# Patient Record
Sex: Male | Born: 1950 | Race: White | Hispanic: No | Marital: Married | Smoking: Never smoker
Health system: Southern US, Community
[De-identification: ages and names within clinical notes are randomized; demographics above are authoritative.]

## PROBLEM LIST (undated history)

## (undated) DIAGNOSIS — G4733 Obstructive sleep apnea (adult) (pediatric): Secondary | ICD-10-CM

## (undated) DIAGNOSIS — M629 Disorder of muscle, unspecified: Secondary | ICD-10-CM

## (undated) DIAGNOSIS — T7840XA Allergy, unspecified, initial encounter: Secondary | ICD-10-CM

## (undated) DIAGNOSIS — M1990 Unspecified osteoarthritis, unspecified site: Secondary | ICD-10-CM

## (undated) DIAGNOSIS — S46212A Strain of muscle, fascia and tendon of other parts of biceps, left arm, initial encounter: Secondary | ICD-10-CM

## (undated) DIAGNOSIS — E785 Hyperlipidemia, unspecified: Secondary | ICD-10-CM

## (undated) DIAGNOSIS — H269 Unspecified cataract: Secondary | ICD-10-CM

## (undated) HISTORY — DX: Obstructive sleep apnea (adult) (pediatric): G47.33

## (undated) HISTORY — DX: Unspecified osteoarthritis, unspecified site: M19.90

## (undated) HISTORY — DX: Disorder of muscle, unspecified: M62.9

## (undated) HISTORY — DX: Allergy, unspecified, initial encounter: T78.40XA

## (undated) HISTORY — DX: Hyperlipidemia, unspecified: E78.5

## (undated) HISTORY — DX: Strain of muscle, fascia and tendon of other parts of biceps, left arm, initial encounter: S46.212A

## (undated) HISTORY — DX: Unspecified cataract: H26.9

---

## 2005-12-25 ENCOUNTER — Encounter: Admission: RE | Admit: 2005-12-25 | Discharge: 2005-12-25 | Payer: Self-pay | Admitting: Otolaryngology

## 2007-05-06 ENCOUNTER — Ambulatory Visit (HOSPITAL_COMMUNITY): Admission: RE | Admit: 2007-05-06 | Discharge: 2007-05-06 | Payer: Self-pay | Admitting: Internal Medicine

## 2007-06-23 HISTORY — PX: CATARACT EXTRACTION, BILATERAL: SHX1313

## 2011-06-23 HISTORY — PX: COLONOSCOPY: SHX174

## 2013-11-08 ENCOUNTER — Ambulatory Visit (INDEPENDENT_AMBULATORY_CARE_PROVIDER_SITE_OTHER): Payer: 59 | Admitting: Emergency Medicine

## 2013-11-08 VITALS — BP 124/64 | HR 58 | Temp 98.1°F | Resp 16 | Ht 66.0 in | Wt 188.0 lb

## 2013-11-08 DIAGNOSIS — R059 Cough, unspecified: Secondary | ICD-10-CM

## 2013-11-08 DIAGNOSIS — J309 Allergic rhinitis, unspecified: Secondary | ICD-10-CM

## 2013-11-08 DIAGNOSIS — R05 Cough: Secondary | ICD-10-CM

## 2013-11-08 MED ORDER — FEXOFENADINE HCL 180 MG PO TABS: 180.0000 mg | ORAL_TABLET | Freq: Every day | ORAL | Status: DC

## 2013-11-08 MED ORDER — TRIAMCINOLONE ACETONIDE 55 MCG/ACT NA AERO: 2.0000 | INHALATION_SPRAY | Freq: Every day | NASAL | Status: DC

## 2013-11-08 NOTE — Patient Instructions (Signed)
Allergic Rhinitis Allergic rhinitis is when the mucous membranes in the nose respond to allergens. Allergens are particles in the air that cause your body to have an allergic reaction. This causes you to release allergic antibodies. Through a chain of events, these eventually cause you to release histamine into the blood stream. Although meant to protect the body, it is this release of histamine that causes your discomfort, such as frequent sneezing, congestion, and an itchy, runny nose.  CAUSES  Seasonal allergic rhinitis (hay fever) is caused by pollen allergens that may come from grasses, trees, and weeds. Year-round allergic rhinitis (perennial allergic rhinitis) is caused by allergens such as house dust mites, pet dander, and mold spores.  SYMPTOMS   Nasal stuffiness (congestion).  Itchy, runny nose with sneezing and tearing of the eyes. DIAGNOSIS  Your health care provider can help you determine the allergen or allergens that trigger your symptoms. If you and your health care provider are unable to determine the allergen, skin or blood testing may be used. TREATMENT  Allergic Rhinitis does not have a cure, but it can be controlled by:  Medicines and allergy shots (immunotherapy).  Avoiding the allergen. Hay fever may often be treated with antihistamines in pill or nasal spray forms. Antihistamines block the effects of histamine. There are over-the-counter medicines that may help with nasal congestion and swelling around the eyes. Check with your health care provider before taking or giving this medicine.  If avoiding the allergen or the medicine prescribed do not work, there are many new medicines your health care provider can prescribe. Stronger medicine may be used if initial measures are ineffective. Desensitizing injections can be used if medicine and avoidance does not work. Desensitization is when a patient is given ongoing shots until the body becomes less sensitive to the allergen.  Make sure you follow up with your health care provider if problems continue. HOME CARE INSTRUCTIONS It is not possible to completely avoid allergens, but you can reduce your symptoms by taking steps to limit your exposure to them. It helps to know exactly what you are allergic to so that you can avoid your specific triggers. SEEK MEDICAL CARE IF:   You have a fever.  You develop a cough that does not stop easily (persistent).  You have shortness of breath.  You start wheezing.  Symptoms interfere with normal daily activities. Document Released: 03/03/2001 Document Revised: 03/29/2013 Document Reviewed: 02/13/2013 Exit[employer] Patient Information 2014 [employer].  

## 2013-11-08 NOTE — Progress Notes (Signed)
[facility] [address], [place] [id number] [phone]  Date:  11/08/2013   Name:  John Soto   DOB:  06/01/1951   MRN:  [id number]  PCP:  Delorse LekBURNETT,BRENT A, MD    Chief Complaint: Cough   History of Present Illness:  John Soto is a 63 y.o. very pleasant male patient who presents with the following:  Ill since last week with nasal congestion and post nasal drainage.  Has no sore throat despite an exposure to strep in his child.  Has a cough productive of a small amount of mucopurulent sputum.  No wheezing or shortness of breath. No fever or chills.   No improvement with over the counter medications or other home remedies. Denies other complaint or health concern today.   There are no active problems to display for this patient.   Past Medical History  Diagnosis Date  . Cataract     History reviewed. No pertinent past surgical history.  History  Substance Use Topics  . Smoking status: Never Smoker   . Smokeless tobacco: Not on file  . Alcohol Use: Not on file    Family History  Problem Relation Age of Onset  . Heart disease Mother   . Heart disease Father     No Known Allergies  Medication list has been reviewed and updated.  No current outpatient prescriptions on file prior to visit.   No current facility-administered medications on file prior to visit.    Review of Systems:  As per HPI, otherwise negative.    Physical Examination: Filed Vitals:   11/08/13 1703  BP: 124/64  Pulse: 58  Temp: 98.1 F (36.7 C)  Resp: 16   Filed Vitals:   11/08/13 1703  Height: 5\' 6"  (1.676 m)  Weight: 188 lb (85.276 kg)   Body mass index is 30.36 kg/(m^2). Ideal Body Weight: Weight in (lb) to have BMI = 25: 154.6  GEN: WDWN, NAD, Non-toxic, A & O x 3 HEENT: Atraumatic, Normocephalic. Neck supple. No masses, No LAD. Ears and Nose: No external deformity. CV: RRR, No M/G/R. No JVD. No thrill. No extra heart sounds. PULM: CTA B,  no wheezes, crackles, rhonchi. No retractions. No resp. distress. No accessory muscle use. ABD: S, NT, ND, +BS. No rebound. No HSM. EXTR: No c/c/e NEURO Normal gait.  PSYCH: Normally interactive. Conversant. Not depressed or anxious appearing.  Calm demeanor.    Assessment and Plan: Seasonal allergic rhinitis Allegra nasacort Phen c cod  Signed,  Phillips OdorJeffery , MD

## 2014-07-02 ENCOUNTER — Institutional Professional Consult (permissible substitution): Payer: Self-pay | Admitting: Pulmonary Disease

## 2014-09-21 ENCOUNTER — Institutional Professional Consult (permissible substitution): Payer: Self-pay | Admitting: Pulmonary Disease

## 2014-11-28 ENCOUNTER — Institutional Professional Consult (permissible substitution): Payer: Self-pay | Admitting: Internal Medicine

## 2014-12-04 ENCOUNTER — Ambulatory Visit (INDEPENDENT_AMBULATORY_CARE_PROVIDER_SITE_OTHER): Payer: 59 | Admitting: Family Medicine

## 2014-12-04 VITALS — BP 100/60 | HR 65 | Temp 98.9°F | Resp 17 | Ht 67.0 in | Wt 187.6 lb

## 2014-12-04 DIAGNOSIS — J0100 Acute maxillary sinusitis, unspecified: Secondary | ICD-10-CM | POA: Diagnosis not present

## 2014-12-04 DIAGNOSIS — J209 Acute bronchitis, unspecified: Secondary | ICD-10-CM

## 2014-12-04 MED ORDER — AZITHROMYCIN 250 MG PO TABS: ORAL_TABLET | ORAL | Status: DC

## 2014-12-04 NOTE — Patient Instructions (Signed)

## 2014-12-04 NOTE — Progress Notes (Signed)
Patient ID: John Soto, male   DOB: March 23, 1951, 64 y.o.   MRN: [id number]   This chart was scribed for John Sidle, MD by [facility], medical scribe at [facility]The patient was seen in exam room 12 and the patient's care was started at 4:45 PM.  Patient ID: John Soto MRN: [id number], DOB: [birth year], 64 y.o. Date of Encounter: 12/04/2014  Primary Physician: Delorse Lek, MD  Chief Complaint:  Chief Complaint  Patient presents with  . Nasal Congestion    Coughing- x 3 weeks   HPI:  [name] is a 64 y.o. male who [facility] complaining of sinus congestion, chest congestion and a cough for three weeks. He is producing a green, yellow mucous. He was working around dust while helping his daughter move back home.  These symptoms have progressively worsened. He is able to exercise but is more fatigued than usual. He works at [employer].  Past Medical History  Diagnosis Date  . Cataract     Home Meds: Prior to Admission medications   Medication Sig Start Date End Date Taking? Authorizing Provider  fexofenadine (ALLEGRA ALLERGY) 180 MG tablet Take 1 tablet (180 mg total) by mouth daily. Patient not taking: Reported on 12/04/2014 11/08/13   Carmelina Dane, MD  niacin-lovastatin (ADVICOR) 1000-20 MG 24 hr tablet Take 1 tablet by mouth at bedtime.    Historical Provider, MD  triamcinolone (NASACORT AQ) 55 MCG/ACT AERO nasal inhaler Place 2 sprays into the nose daily. Patient not taking: Reported on 12/04/2014 11/08/13   Carmelina Dane, MD   Allergies: No Known Allergies  History   Social History  . Marital Status: Married    Spouse Name: N/A  . Number of Children: N/A  . Years of Education: N/A   Occupational History  . Not on file.   Social History Main Topics  . Smoking status: Never Smoker   . Smokeless tobacco: Not on file  . Alcohol Use: Not on file  . Drug Use: Not on file  . Sexual Activity: Not on  file   Other Topics Concern  . Not on file   Social History Narrative    Review of Systems: Constitutional: negative for chills, fever, night sweats, weight changes, positive for fatigue  HEENT: negative for vision changes, hearing loss, rhinorrhea, ST, epistaxis. Positive for chest congestion, nasal congestion. Cardiovascular: negative for chest pain or palpitations Respiratory: negative for hemoptysis, wheezing, shortness of breath, positive for cough. Abdominal: negative for abdominal pain, nausea, vomiting, diarrhea, or constipation Dermatological: negative for rash Neurologic: negative for headache, dizziness, or syncope All other systems reviewed and are otherwise negative with the exception to those above and in the HPI.  Physical Exam: Blood pressure 100/60, pulse 65, temperature 98.9 F (37.2 C), temperature source Oral, resp. rate 17, height 5\' 7"  (1.702 m), weight 187 lb 9.6 oz (85.095 kg), SpO2 98 %., Body mass index is 29.38 kg/(m^2). General: Well developed, well nourished, in no acute distress. Head: Normocephalic, atraumatic, eyes without discharge, sclera non-icteric,  Bilateral auditory canals clear, TM's are without perforation, pearly grey and translucent with reflective cone of light bilaterally. Oral cavity moist, posterior pharynx without exudate, erythema, peritonsillar abscess, or post nasal drip. Nasal passages are swollen. Neck: Supple. No thyromegaly. Full ROM. No lymphadenopathy. Lungs: Few crackles in the bases. Heart: RRR with S1 S2. No murmurs, rubs, or gallops appreciated. Abdomen: Soft, non-tender, non-distended with normoactive bowel sounds. No  hepatomegaly. No rebound/guarding. No obvious abdominal masses. Msk:  Strength and tone normal for age. Extremities/Skin: Warm and dry. No clubbing or cyanosis. No edema. No rashes or suspicious lesions. Neuro: Alert and oriented X 3. Moves all extremities spontaneously. Gait is normal. CNII-XII grossly in  tact. Psych:  Responds to questions appropriately with a normal affect.   Labs:  ASSESSMENT AND PLAN:  64 y.o. year old male with   This chart was scribed in my presence and reviewed by me personally.    ICD-9-CM ICD-10-CM   1. Acute bronchitis, unspecified organism 466.0 J20.9 azithromycin (ZITHROMAX Z-PAK) 250 MG tablet  2. Subacute maxillary sinusitis 461.0 J01.00 azithromycin (ZITHROMAX Z-PAK) 250 MG tablet     Signed, John Sidle, MD  Signed, John Sidle, MD 12/04/2014 4:45 PM

## 2015-03-04 ENCOUNTER — Institutional Professional Consult (permissible substitution): Payer: Self-pay | Admitting: Internal Medicine

## 2015-03-22 ENCOUNTER — Telehealth: Payer: Self-pay | Admitting: Family Medicine

## 2015-03-22 NOTE — Telephone Encounter (Signed)
Yes I can see him  ?

## 2015-03-22 NOTE — Telephone Encounter (Signed)
Pt wife Elmor Kost would like to know if Dr. Clent Ridges will accept her husband as a new patient.... Can I schedule?

## 2015-03-22 NOTE — Telephone Encounter (Signed)
Pt has been sch

## 2015-04-24 ENCOUNTER — Encounter: Payer: Self-pay | Admitting: Family Medicine

## 2015-04-24 ENCOUNTER — Ambulatory Visit (INDEPENDENT_AMBULATORY_CARE_PROVIDER_SITE_OTHER): Payer: Commercial Managed Care - HMO | Admitting: Family Medicine

## 2015-04-24 VITALS — BP 126/76 | HR 51 | Temp 98.0°F | Ht 67.0 in | Wt 186.0 lb

## 2015-04-24 DIAGNOSIS — G4733 Obstructive sleep apnea (adult) (pediatric): Secondary | ICD-10-CM | POA: Diagnosis not present

## 2015-04-24 DIAGNOSIS — Z91048 Other nonmedicinal substance allergy status: Secondary | ICD-10-CM

## 2015-04-24 DIAGNOSIS — R739 Hyperglycemia, unspecified: Secondary | ICD-10-CM

## 2015-04-24 DIAGNOSIS — E785 Hyperlipidemia, unspecified: Secondary | ICD-10-CM

## 2015-04-24 DIAGNOSIS — M19042 Primary osteoarthritis, left hand: Secondary | ICD-10-CM

## 2015-04-24 DIAGNOSIS — Z9109 Other allergy status, other than to drugs and biological substances: Secondary | ICD-10-CM

## 2015-04-24 DIAGNOSIS — M19041 Primary osteoarthritis, right hand: Secondary | ICD-10-CM

## 2015-04-24 NOTE — Progress Notes (Signed)
Pre visit review using our clinic review tool, if applicable. No additional management support is needed unless otherwise documented below in the visit note. 

## 2015-04-25 ENCOUNTER — Encounter: Payer: Self-pay | Admitting: Family Medicine

## 2015-04-25 DIAGNOSIS — R739 Hyperglycemia, unspecified: Secondary | ICD-10-CM | POA: Insufficient documentation

## 2015-04-25 DIAGNOSIS — M19042 Primary osteoarthritis, left hand: Secondary | ICD-10-CM

## 2015-04-25 DIAGNOSIS — M19041 Primary osteoarthritis, right hand: Secondary | ICD-10-CM | POA: Insufficient documentation

## 2015-04-25 DIAGNOSIS — Z9109 Other allergy status, other than to drugs and biological substances: Secondary | ICD-10-CM | POA: Insufficient documentation

## 2015-04-25 DIAGNOSIS — E785 Hyperlipidemia, unspecified: Secondary | ICD-10-CM | POA: Insufficient documentation

## 2015-04-25 DIAGNOSIS — G4733 Obstructive sleep apnea (adult) (pediatric): Secondary | ICD-10-CM | POA: Insufficient documentation

## 2015-04-25 NOTE — Progress Notes (Signed)
   Subjective:    Patient ID: John Soto, male    DOB: 07/20/1950, 64 y.o.   MRN: [id number]  HPI 64 yr old male to establish with us after transferring from Dr. Marjory LiesBrent Burnett. I already see his wife and daughter. He is currently doing well and has no complaints. He is a little past due for a cpx and labs. His last total cholesterol was 240 and he admits to not being as strict about his diet as he should be. He has a hx of borderline high glucoses. He walks almost every day for exercise and he goes to the gym 3-5 days a week. He has sleep apnea and he even has a CPAP machine at home, but he never uses this. He has made up his mind to lose weight and treat the sleep apnea by that means. 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    Review of Systems  Constitutional: Negative.   Respiratory: Negative.   Cardiovascular: Negative.   Gastrointestinal: Negative.   Musculoskeletal: Positive for arthralgias. Negative for joint swelling.  Neurological: Negative.        Objective:   Physical Exam  Constitutional: He is oriented to person, place, and time. He appears well-developed and well-nourished.  Neck: No thyromegaly present.  Cardiovascular: Normal rate, regular rhythm, normal heart sounds and intact distal pulses.   Pulmonary/Chest: Effort normal and breath sounds normal.  Lymphadenopathy:    He has no cervical adenopathy.  Neurological: He is alert and oriented to person, place, and time.          Assessment & Plan:  Introductory visit for this patient. We will arrange to get his medical records. He will set up a cpx with fasting labs soon.

## 2015-05-21 ENCOUNTER — Other Ambulatory Visit (INDEPENDENT_AMBULATORY_CARE_PROVIDER_SITE_OTHER): Payer: Commercial Managed Care - HMO

## 2015-05-21 DIAGNOSIS — Z0000 Encounter for general adult medical examination without abnormal findings: Secondary | ICD-10-CM

## 2015-05-21 LAB — LIPID PANEL
CHOL/HDL RATIO: 5
Cholesterol: 221 mg/dL — ABNORMAL HIGH (ref 0–200)
HDL: 41.5 mg/dL (ref >39.00)
LDL CALC: 156 mg/dL — AB (ref 0–99)
NonHDL: 179.2
Triglycerides: 115 mg/dL (ref 0.0–149.0)
VLDL: 23 mg/dL (ref 0.0–40.0)

## 2015-05-21 LAB — POCT URINALYSIS DIPSTICK
BILIRUBIN UA: NEGATIVE
GLUCOSE UA: NEGATIVE
Ketones, UA: NEGATIVE
NITRITE UA: NEGATIVE
Protein, UA: NEGATIVE
RBC UA: NEGATIVE
SPEC GRAV UA: 1.025
UROBILINOGEN UA: 0.2
pH, UA: 5

## 2015-05-21 LAB — CBC WITH DIFFERENTIAL/PLATELET
BASOS PCT: 0.6 % (ref 0.0–3.0)
Basophils Absolute: 0 10*3/uL (ref 0.0–0.1)
EOS ABS: 0.1 10*3/uL (ref 0.0–0.7)
EOS PCT: 2.5 % (ref 0.0–5.0)
HCT: 47 % (ref 39.0–52.0)
Hemoglobin: 15.8 g/dL (ref 13.0–17.0)
LYMPHS ABS: 1.7 10*3/uL (ref 0.7–4.0)
Lymphocytes Relative: 31.4 % (ref 12.0–46.0)
MCHC: 33.5 g/dL (ref 30.0–36.0)
MCV: 85.6 fl (ref 78.0–100.0)
MONO ABS: 0.6 10*3/uL (ref 0.1–1.0)
Monocytes Relative: 10.5 % (ref 3.0–12.0)
NEUTROS PCT: 55 % (ref 43.0–77.0)
Neutro Abs: 3 10*3/uL (ref 1.4–7.7)
Platelets: 256 10*3/uL (ref 150.0–400.0)
RBC: 5.49 Mil/uL (ref 4.22–5.81)
RDW: 13.4 % (ref 11.5–15.5)
WBC: 5.4 10*3/uL (ref 4.0–10.5)

## 2015-05-21 LAB — HEPATIC FUNCTION PANEL
ALT: 19 U/L (ref 0–53)
AST: 16 U/L (ref 0–37)
Albumin: 4.4 g/dL (ref 3.5–5.2)
Alkaline Phosphatase: 50 U/L (ref 39–117)
BILIRUBIN TOTAL: 0.9 mg/dL (ref 0.2–1.2)
Bilirubin, Direct: 0.1 mg/dL (ref 0.0–0.3)
Total Protein: 6.7 g/dL (ref 6.0–8.3)

## 2015-05-21 LAB — PSA: PSA: 2 ng/mL (ref 0.10–4.00)

## 2015-05-21 LAB — BASIC METABOLIC PANEL
BUN: 24 mg/dL — AB (ref 6–23)
CO2: 29 mEq/L (ref 19–32)
CREATININE: 1.2 mg/dL (ref 0.40–1.50)
Calcium: 10 mg/dL (ref 8.4–10.5)
Chloride: 105 mEq/L (ref 96–112)
GFR: 65.01 mL/min (ref >60.00)
GLUCOSE: 110 mg/dL — AB (ref 70–99)
POTASSIUM: 5.3 meq/L — AB (ref 3.5–5.1)
Sodium: 142 mEq/L (ref 135–145)

## 2015-05-21 LAB — TSH: TSH: 1.74 u[IU]/mL (ref 0.35–4.50)

## 2015-05-28 ENCOUNTER — Ambulatory Visit (INDEPENDENT_AMBULATORY_CARE_PROVIDER_SITE_OTHER): Payer: Commercial Managed Care - HMO | Admitting: Family Medicine

## 2015-05-28 ENCOUNTER — Encounter: Payer: Self-pay | Admitting: Family Medicine

## 2015-05-28 VITALS — BP 128/70 | HR 55 | Temp 97.8°F | Ht 66.5 in | Wt 189.0 lb

## 2015-05-28 DIAGNOSIS — Z0000 Encounter for general adult medical examination without abnormal findings: Secondary | ICD-10-CM

## 2015-05-28 DIAGNOSIS — E785 Hyperlipidemia, unspecified: Secondary | ICD-10-CM

## 2015-05-28 DIAGNOSIS — R739 Hyperglycemia, unspecified: Secondary | ICD-10-CM

## 2015-05-28 NOTE — Progress Notes (Signed)
   Subjective:    Patient ID: John Soto, male    DOB: 09/15/1950, 64 y.o.   MRN: [id number]  HPI 64 yr old male for a cpx. He feels well. He confesses that he stopped taking his lipid medication one month ago, and he wants to try to get his levels down with diet and exercise.    Review of Systems  Constitutional: Negative.   HENT: Negative.   Eyes: Negative.   Respiratory: Negative.   Cardiovascular: Negative.   Gastrointestinal: Negative.   Genitourinary: Negative.   Musculoskeletal: Negative.   Skin: Negative.   Neurological: Negative.   Psychiatric/Behavioral: Negative.        Objective:   Physical Exam  Constitutional: He is oriented to person, place, and time. He appears well-developed and well-nourished. No distress.  HENT:  Head: Normocephalic and atraumatic.  Right Ear: External ear normal.  Left Ear: External ear normal.  Nose: Nose normal.  Mouth/Throat: Oropharynx is clear and moist. No oropharyngeal exudate.  Eyes: Conjunctivae and EOM are normal. Pupils are equal, round, and reactive to light. Right eye exhibits no discharge. Left eye exhibits no discharge. No scleral icterus.  Neck: Neck supple. No JVD present. No tracheal deviation present. No thyromegaly present.  Cardiovascular: Normal rate, regular rhythm, normal heart sounds and intact distal pulses.  Exam reveals no gallop and no friction rub.   No murmur heard. EKG normal   Pulmonary/Chest: Effort normal and breath sounds normal. No respiratory distress. He has no wheezes. He has no rales. He exhibits no tenderness.  Abdominal: Soft. Bowel sounds are normal. He exhibits no distension and no mass. There is no tenderness. There is no rebound and no guarding.  Genitourinary: Rectum normal, prostate normal and penis normal. Guaiac negative stool. No penile tenderness.  Musculoskeletal: Normal range of motion. He exhibits no edema or tenderness.  Lymphadenopathy:    He has no cervical adenopathy.    Neurological: He is alert and oriented to person, place, and time. He has normal reflexes. No cranial nerve deficit. He exhibits normal muscle tone. Coordination normal.  Skin: Skin is warm and dry. No rash noted. He is not diaphoretic. No erythema. No pallor.  Psychiatric: He has a normal mood and affect. His behavior is normal. Judgment and thought content normal.          Assessment & Plan:  Well exam. We discussed diet and exercise. He will stay off meds for now and recheck an A1c and lipid panel in 6 months.

## 2015-05-28 NOTE — Progress Notes (Signed)
Pre visit review using our clinic review tool, if applicable. No additional management support is needed unless otherwise documented below in the visit note. 

## 2015-06-10 ENCOUNTER — Ambulatory Visit: Payer: Commercial Managed Care - HMO

## 2015-06-10 ENCOUNTER — Ambulatory Visit (INDEPENDENT_AMBULATORY_CARE_PROVIDER_SITE_OTHER): Payer: Commercial Managed Care - HMO | Admitting: Emergency Medicine

## 2015-06-10 ENCOUNTER — Ambulatory Visit (INDEPENDENT_AMBULATORY_CARE_PROVIDER_SITE_OTHER): Payer: Commercial Managed Care - HMO

## 2015-06-10 VITALS — BP 136/82 | HR 82 | Temp 97.5°F | Resp 20

## 2015-06-10 DIAGNOSIS — M25512 Pain in left shoulder: Secondary | ICD-10-CM | POA: Diagnosis not present

## 2015-06-10 DIAGNOSIS — S43005A Unspecified dislocation of left shoulder joint, initial encounter: Secondary | ICD-10-CM

## 2015-06-10 MED ORDER — HYDROCODONE-ACETAMINOPHEN 5-325 MG PO TABS: 1.0000 | ORAL_TABLET | Freq: Four times a day (QID) | ORAL | Status: DC | PRN

## 2015-06-10 MED ORDER — KETOROLAC TROMETHAMINE 60 MG/2ML IM SOLN
60.0000 mg | Freq: Once | INTRAMUSCULAR | Status: AC
  Administered 2015-06-10: 60 mg via INTRAMUSCULAR

## 2015-06-10 MED ORDER — CYCLOBENZAPRINE HCL 5 MG PO TABS: 5.0000 mg | ORAL_TABLET | Freq: Three times a day (TID) | ORAL | Status: DC | PRN

## 2015-06-10 NOTE — Patient Instructions (Signed)
Ice to area.

## 2015-06-10 NOTE — Progress Notes (Signed)
   John GalasRichard G Soto  MRN: [id number] DOB: 09/07/1950  Subjective:  Pt presents to clinic after a fall about an hour ago when he tripped an landed on his left shoulder and had intense pain. He thinks that he dislocated his shoulder.  He is having no paresthesias on his hand.  Patient Active Problem List   Diagnosis Date Noted  . Obstructive sleep apnea 04/25/2015  . Hyperlipidemia 04/25/2015  . Hyperglycemia 04/25/2015  . Environmental allergies 04/25/2015  . Osteoarthritis of both hands 04/25/2015    No current outpatient prescriptions on file prior to visit.   No current facility-administered medications on file prior to visit.    No Known Allergies  Review of Systems Objective:  BP 136/82 mmHg  Pulse 82  Temp(Src) 97.5 F (36.4 C) (Oral)  Resp 20  SpO2 98%  Physical Exam  Constitutional: He is oriented to person, place, and time and well-developed, well-nourished, and in no distress.  HENT:  Head: Normocephalic and atraumatic.  Right Ear: External ear normal.  Left Ear: External ear normal.  Eyes: Conjunctivae are normal.  Neck: Normal range of motion.  Pulmonary/Chest: Effort normal.  Musculoskeletal:  Obvious sulcus on left shoulder - pt in obvious discomfort.  Dr Cleta Albertsaub and I with traction relocated the shoulder.  The patient had a good radial pulse before and after the relocation.  He was still quite tender on posterior shoulder but once he got in a sling and had an injection of Toradol he felt much better.    Neurological: He is alert and oriented to person, place, and time. Gait normal.  Skin: Skin is warm and dry.  Psychiatric: Mood, memory, affect and judgment normal.    [facility] reading (PRIMARY) by  Dr. Cleta Albertsaub.  Dislocation post-reduction films - please comment on head of humerus and calcification in joint space  Assessment and Plan :  Left shoulder pain - Plan: DG Shoulder Left, DG Shoulder Left  Shoulder dislocation, left, initial encounter - Plan: DG Shoulder  Left, ketorolac (TORADOL) injection 60 mg, cyclobenzaprine (FLEXERIL) 5 MG tablet, HYDROcodone-acetaminophen (NORCO/VICODIN) 5-325 MG tablet, Ambulatory referral to Orthopedic Surgery, Care order/instruction   Pt will f/u with ortho.  He will use ice and continue sling and swath for protection of the shoulder to prevent repeat dislocation.  Benny LennertSarah Weber PA-C  [facility] 06/10/2015 8:34 PM

## 2015-06-11 ENCOUNTER — Telehealth: Payer: Self-pay

## 2015-06-11 NOTE — Telephone Encounter (Signed)
Pt would like to come by and pick up a copy of his xrays of the shoulder, have an appt on Thursday. Please call [phone] when ready, he would really like to pick up today if possible

## 2015-06-28 ENCOUNTER — Institutional Professional Consult (permissible substitution): Payer: Self-pay | Admitting: Internal Medicine

## 2016-05-28 ENCOUNTER — Other Ambulatory Visit (INDEPENDENT_AMBULATORY_CARE_PROVIDER_SITE_OTHER): Payer: Managed Care, Other (non HMO)

## 2016-05-28 DIAGNOSIS — Z0000 Encounter for general adult medical examination without abnormal findings: Secondary | ICD-10-CM | POA: Diagnosis not present

## 2016-05-28 LAB — LIPID PANEL
CHOL/HDL RATIO: 5
Cholesterol: 232 mg/dL — ABNORMAL HIGH (ref 0–200)
HDL: 47.3 mg/dL (ref >39.00)
LDL Cholesterol: 166 mg/dL — ABNORMAL HIGH (ref 0–99)
NONHDL: 184.39
TRIGLYCERIDES: 93 mg/dL (ref 0.0–149.0)
VLDL: 18.6 mg/dL (ref 0.0–40.0)

## 2016-05-28 LAB — POC URINALSYSI DIPSTICK (AUTOMATED)
Bilirubin, UA: NEGATIVE
Blood, UA: NEGATIVE
Glucose, UA: NEGATIVE
KETONES UA: NEGATIVE
Nitrite, UA: NEGATIVE
PH UA: 5.5
PROTEIN UA: NEGATIVE
SPEC GRAV UA: 1.015
UROBILINOGEN UA: 0.2

## 2016-05-28 LAB — HEPATIC FUNCTION PANEL
ALBUMIN: 4.6 g/dL (ref 3.5–5.2)
ALT: 18 U/L (ref 0–53)
AST: 15 U/L (ref 0–37)
Alkaline Phosphatase: 44 U/L (ref 39–117)
Bilirubin, Direct: 0.1 mg/dL (ref 0.0–0.3)
TOTAL PROTEIN: 7.2 g/dL (ref 6.0–8.3)
Total Bilirubin: 0.6 mg/dL (ref 0.2–1.2)

## 2016-05-28 LAB — BASIC METABOLIC PANEL
BUN: 17 mg/dL (ref 6–23)
CALCIUM: 9.6 mg/dL (ref 8.4–10.5)
CO2: 26 meq/L (ref 19–32)
CREATININE: 0.97 mg/dL (ref 0.40–1.50)
Chloride: 105 mEq/L (ref 96–112)
GFR: 82.84 mL/min (ref >60.00)
Glucose, Bld: 106 mg/dL — ABNORMAL HIGH (ref 70–99)
Potassium: 4.4 mEq/L (ref 3.5–5.1)
SODIUM: 140 meq/L (ref 135–145)

## 2016-05-28 LAB — CBC WITH DIFFERENTIAL/PLATELET
BASOS PCT: 0.5 % (ref 0.0–3.0)
Basophils Absolute: 0 10*3/uL (ref 0.0–0.1)
EOS ABS: 0.1 10*3/uL (ref 0.0–0.7)
EOS PCT: 1.8 % (ref 0.0–5.0)
HEMATOCRIT: 45.6 % (ref 39.0–52.0)
HEMOGLOBIN: 15.5 g/dL (ref 13.0–17.0)
LYMPHS PCT: 30.6 % (ref 12.0–46.0)
Lymphs Abs: 1.8 10*3/uL (ref 0.7–4.0)
MCHC: 34 g/dL (ref 30.0–36.0)
MCV: 86.2 fl (ref 78.0–100.0)
Monocytes Absolute: 0.6 10*3/uL (ref 0.1–1.0)
Monocytes Relative: 9.7 % (ref 3.0–12.0)
Neutro Abs: 3.3 10*3/uL (ref 1.4–7.7)
Neutrophils Relative %: 57.4 % (ref 43.0–77.0)
Platelets: 251 10*3/uL (ref 150.0–400.0)
RBC: 5.29 Mil/uL (ref 4.22–5.81)
RDW: 13.7 % (ref 11.5–15.5)
WBC: 5.8 10*3/uL (ref 4.0–10.5)

## 2016-05-28 LAB — PSA: PSA: 2.13 ng/mL (ref 0.10–4.00)

## 2016-05-28 LAB — TSH: TSH: 1.57 u[IU]/mL (ref 0.35–4.50)

## 2016-06-01 ENCOUNTER — Ambulatory Visit (INDEPENDENT_AMBULATORY_CARE_PROVIDER_SITE_OTHER): Payer: Managed Care, Other (non HMO) | Admitting: Family Medicine

## 2016-06-01 ENCOUNTER — Encounter: Payer: Self-pay | Admitting: Family Medicine

## 2016-06-01 VITALS — BP 98/63 | HR 65 | Temp 98.4°F | Ht 66.5 in | Wt 176.0 lb

## 2016-06-01 DIAGNOSIS — Z0000 Encounter for general adult medical examination without abnormal findings: Secondary | ICD-10-CM | POA: Diagnosis not present

## 2016-06-01 DIAGNOSIS — Z209 Contact with and (suspected) exposure to unspecified communicable disease: Secondary | ICD-10-CM | POA: Diagnosis not present

## 2016-06-01 NOTE — Progress Notes (Signed)
   Subjective:    Patient ID: John Soto, male    DOB: 03/19/1951, 65 y.o.   MRN: [id number]  HPI 65 yr old male for a well exam. He is doing well except for recovering from a severe left dislocated shoulder. He fell and injured the shoulder last December, tearing 3 rotator cuff muscles and his biceps. He had surgery and is still going to therapy.    Review of Systems  Constitutional: Negative.   HENT: Negative.   Eyes: Negative.   Respiratory: Negative.   Cardiovascular: Negative.   Gastrointestinal: Negative.   Genitourinary: Negative.   Musculoskeletal: Positive for arthralgias. Negative for back pain, gait problem, joint swelling, myalgias, neck pain and neck stiffness.  Skin: Negative.   Neurological: Negative.   Psychiatric/Behavioral: Negative.        Objective:   Physical Exam  Constitutional: He is oriented to person, place, and time. He appears well-developed and well-nourished. No distress.  HENT:  Head: Normocephalic and atraumatic.  Right Ear: External ear normal.  Left Ear: External ear normal.  Nose: Nose normal.  Mouth/Throat: Oropharynx is clear and moist. No oropharyngeal exudate.  Eyes: Conjunctivae and EOM are normal. Pupils are equal, round, and reactive to light. Right eye exhibits no discharge. Left eye exhibits no discharge. No scleral icterus.  Neck: Neck supple. No JVD present. No tracheal deviation present. No thyromegaly present.  Cardiovascular: Normal rate, regular rhythm, normal heart sounds and intact distal pulses.  Exam reveals no gallop and no friction rub.   No murmur heard. Pulmonary/Chest: Effort normal and breath sounds normal. No respiratory distress. He has no wheezes. He has no rales. He exhibits no tenderness.  Abdominal: Soft. Bowel sounds are normal. He exhibits no distension and no mass. There is no tenderness. There is no rebound and no guarding.  Genitourinary: Rectum normal, prostate normal and penis normal. Rectal exam shows  guaiac negative stool. No penile tenderness.  Musculoskeletal: Normal range of motion. He exhibits no edema or tenderness.  Lymphadenopathy:    He has no cervical adenopathy.  Neurological: He is alert and oriented to person, place, and time. He has normal reflexes. No cranial nerve deficit. He exhibits normal muscle tone. Coordination normal.  Skin: Skin is warm and dry. No rash noted. He is not diaphoretic. No erythema. No pallor.  Psychiatric: He has a normal mood and affect. His behavior is normal. Judgment and thought content normal.          Assessment & Plan:  Well exam. We discussed diet and exercise.  Nelwyn SalisburyFRY, A, MD

## 2016-06-01 NOTE — Progress Notes (Signed)
Pre visit review using our clinic review tool, if applicable. No additional management support is needed unless otherwise documented below in the visit note. 

## 2016-06-02 LAB — HEPATITIS C ANTIBODY: HCV AB: NEGATIVE

## 2016-09-22 ENCOUNTER — Ambulatory Visit: Payer: Managed Care, Other (non HMO) | Admitting: Adult Health

## 2017-02-23 ENCOUNTER — Ambulatory Visit (INDEPENDENT_AMBULATORY_CARE_PROVIDER_SITE_OTHER): Payer: 59 | Admitting: Family Medicine

## 2017-02-23 ENCOUNTER — Encounter: Payer: Self-pay | Admitting: Family Medicine

## 2017-02-23 VITALS — BP 128/84 | HR 60 | Temp 98.1°F | Ht 66.5 in | Wt 176.0 lb

## 2017-02-23 DIAGNOSIS — R109 Unspecified abdominal pain: Secondary | ICD-10-CM | POA: Diagnosis not present

## 2017-02-23 DIAGNOSIS — N200 Calculus of kidney: Secondary | ICD-10-CM | POA: Diagnosis not present

## 2017-02-23 DIAGNOSIS — E782 Mixed hyperlipidemia: Secondary | ICD-10-CM

## 2017-02-23 DIAGNOSIS — R1030 Lower abdominal pain, unspecified: Secondary | ICD-10-CM | POA: Diagnosis not present

## 2017-02-23 LAB — POC URINALSYSI DIPSTICK (AUTOMATED)
BILIRUBIN UA: NEGATIVE
GLUCOSE UA: NEGATIVE
Ketones, UA: NEGATIVE
Leukocytes, UA: NEGATIVE
Nitrite, UA: NEGATIVE
Protein, UA: NEGATIVE
SPEC GRAV UA: 1.01 (ref 1.010–1.025)
Urobilinogen, UA: 0.2 E.U./dL
pH, UA: 6 (ref 5.0–8.0)

## 2017-02-23 MED ORDER — OXYCODONE-ACETAMINOPHEN 10-325 MG PO TABS: 1.0000 | ORAL_TABLET | Freq: Four times a day (QID) | ORAL | 0 refills | Status: DC | PRN

## 2017-02-23 NOTE — Patient Instructions (Signed)
WE NOW OFFER   [facility] FAST TRACK!!!  SAME DAY Appointments for ACUTE CARE  Such as: Sprains, Injuries, cuts, abrasions, rashes, muscle pain, joint pain, back pain Colds, flu, sore throats, headache, allergies, cough, fever  Ear pain, sinus and eye infections Abdominal pain, nausea, vomiting, diarrhea, upset stomach Animal/insect bites  3 Easy Ways to Schedule: Walk-In Scheduling Call in scheduling Mychart Sign-up: [email or web address]         

## 2017-02-23 NOTE — Addendum Note (Signed)
Addended by: Aniceto BossNIMMONS, SYLVIA A on: 02/23/2017 04:45 PM   Modules accepted: Orders

## 2017-02-23 NOTE — Progress Notes (Signed)
   Subjective:    Patient ID: John Soto, male    DOB: 09/08/1950, 66 y.o.   MRN: [id number]  HPI Here for the sudden onset last night of a sharp severe pain in the left middle back area. He has never had this before. He says position changes did not make it worse or better. He had some nausea with it but did not vomit. No fever. He had gone backpacking earlier in the day yesterday. No change in urinations or BMs. No blood in the urine but he said it looked a little dark yesterday.  The pain lasted about 8 hours and then suddenly stopped. He has felt okay most of today.    Review of Systems  Constitutional: Negative.   Respiratory: Negative.   Cardiovascular: Negative.   Gastrointestinal: Negative.   Genitourinary: Negative.   Musculoskeletal: Positive for back pain.       Objective:   Physical Exam  Constitutional: He is oriented to person, place, and time. He appears well-developed and well-nourished. No distress.  Cardiovascular: Normal rate, regular rhythm, normal heart sounds and intact distal pulses.   Pulmonary/Chest: Effort normal and breath sounds normal. No respiratory distress. He has no wheezes. He has no rales.  Abdominal: Soft. Bowel sounds are normal. He exhibits no distension and no mass. There is no tenderness. There is no rebound and no guarding.  Musculoskeletal:  The back is not tender, ROM is full   Neurological: He is alert and oriented to person, place, and time.          Assessment & Plan:  Back pain, suggestive of a kidney stone. His UA has a little blood in it today. We will give him some pain medication to use prn. He will drink lots of water. We will set up a CT scan of the abdomen and pelvis soon.  Gershon CraneStephen Fry, MD

## 2017-02-24 LAB — LIPID PANEL
CHOL/HDL RATIO: 4
Cholesterol: 207 mg/dL — ABNORMAL HIGH (ref 0–200)
HDL: 47.9 mg/dL (ref >39.00)
LDL Cholesterol: 138 mg/dL — ABNORMAL HIGH (ref 0–99)
NONHDL: 159.36
TRIGLYCERIDES: 108 mg/dL (ref 0.0–149.0)
VLDL: 21.6 mg/dL (ref 0.0–40.0)

## 2017-02-24 LAB — BASIC METABOLIC PANEL
BUN: 14 mg/dL (ref 6–23)
CHLORIDE: 104 meq/L (ref 96–112)
CO2: 27 meq/L (ref 19–32)
CREATININE: 0.94 mg/dL (ref 0.40–1.50)
Calcium: 9.6 mg/dL (ref 8.4–10.5)
GFR: 85.69 mL/min (ref >60.00)
Glucose, Bld: 99 mg/dL (ref 70–99)
Potassium: 4.6 mEq/L (ref 3.5–5.1)
Sodium: 139 mEq/L (ref 135–145)

## 2017-02-24 LAB — CBC WITH DIFFERENTIAL/PLATELET
BASOS PCT: 1.3 % (ref 0.0–3.0)
Basophils Absolute: 0.1 10*3/uL (ref 0.0–0.1)
EOS ABS: 0.1 10*3/uL (ref 0.0–0.7)
Eosinophils Relative: 0.6 % (ref 0.0–5.0)
HCT: 46.2 % (ref 39.0–52.0)
Hemoglobin: 15.2 g/dL (ref 13.0–17.0)
LYMPHS PCT: 22 % (ref 12.0–46.0)
Lymphs Abs: 2 10*3/uL (ref 0.7–4.0)
MCHC: 32.8 g/dL (ref 30.0–36.0)
MCV: 88.9 fl (ref 78.0–100.0)
Monocytes Absolute: 0.7 10*3/uL (ref 0.1–1.0)
Monocytes Relative: 8.1 % (ref 3.0–12.0)
NEUTROS ABS: 6.2 10*3/uL (ref 1.4–7.7)
Neutrophils Relative %: 68 % (ref 43.0–77.0)
PLATELETS: 242 10*3/uL (ref 150.0–400.0)
RBC: 5.2 Mil/uL (ref 4.22–5.81)
RDW: 13.6 % (ref 11.5–15.5)
WBC: 9.2 10*3/uL (ref 4.0–10.5)

## 2017-02-24 NOTE — Addendum Note (Signed)
Addended by: Gershon CraneFRY,  A on: 02/24/2017 04:13 PM   Modules accepted: Orders

## 2017-02-25 ENCOUNTER — Ambulatory Visit
Admission: RE | Admit: 2017-02-25 | Discharge: 2017-02-25 | Disposition: A | Discharge status: Home / Self Care | Payer: 59 | Source: Ambulatory Visit | Attending: Family Medicine | Admitting: Family Medicine

## 2017-02-25 ENCOUNTER — Telehealth: Payer: Self-pay | Admitting: Family Medicine

## 2017-02-25 ENCOUNTER — Other Ambulatory Visit: Payer: Self-pay | Admitting: Adult Health

## 2017-02-25 DIAGNOSIS — R109 Unspecified abdominal pain: Secondary | ICD-10-CM

## 2017-02-25 MED ORDER — TAMSULOSIN HCL 0.4 MG PO CAPS: 0.4000 mg | ORAL_CAPSULE | Freq: Every day | ORAL | 1 refills | Status: DC

## 2017-02-25 NOTE — Telephone Encounter (Signed)
I spoke with pt and went over results, pt will pick up script today. Also I will forward this to Dr. Clent RidgesFry for review.

## 2017-02-25 NOTE — Telephone Encounter (Signed)
CT of the abomen shows a 2 x 4 mm kidney stone on the left side with additional small kidney stones.   I will call in a prescription for Flomax to help him pass the stone easier.

## 2017-02-25 NOTE — Telephone Encounter (Signed)
Report was called to office today. Dr. Clent RidgesFry has left for the day, I will ask another provider to review results from CT.

## 2017-03-01 NOTE — Telephone Encounter (Signed)
noted 

## 2017-03-02 NOTE — Addendum Note (Signed)
Addended by: Gershon CraneFRY,  A on: 03/02/2017 04:51 PM   Modules accepted: Orders

## 2017-03-03 ENCOUNTER — Encounter: Payer: Self-pay | Admitting: Family Medicine

## 2017-03-03 NOTE — Telephone Encounter (Signed)
He is correct. The stone is almost to the bladder. His back pain is from back pressure in the kidney. His Urology referral is pending.

## 2017-03-11 ENCOUNTER — Encounter: Payer: Self-pay | Admitting: Family Medicine

## 2017-07-14 ENCOUNTER — Ambulatory Visit (INDEPENDENT_AMBULATORY_CARE_PROVIDER_SITE_OTHER): Payer: 59 | Admitting: Family Medicine

## 2017-07-14 ENCOUNTER — Encounter: Payer: Self-pay | Admitting: Family Medicine

## 2017-07-14 VITALS — BP 110/60 | HR 73 | Temp 98.5°F | Ht 66.5 in | Wt 186.8 lb

## 2017-07-14 DIAGNOSIS — J029 Acute pharyngitis, unspecified: Secondary | ICD-10-CM

## 2017-07-14 LAB — POCT RAPID STREP A (OFFICE): RAPID STREP A SCREEN: NEGATIVE

## 2017-07-14 MED ORDER — AMOXICILLIN-POT CLAVULANATE 875-125 MG PO TABS: 1.0000 | ORAL_TABLET | Freq: Two times a day (BID) | ORAL | 0 refills | Status: DC

## 2017-07-14 NOTE — Progress Notes (Signed)
Subjective:     Patient ID: John Soto, male   DOB: 03/28/1951, 67 y.o.   MRN: [id number]  HPI Patient seen with, couple day history of sore throat and nasal congestion with some brownish nasal discharge. Had some fatigue. Rare cough. He was concerned because apparently his daughter was diagnosed with non-group A strep couple weeks ago. Patient denies any fever. Sore throat has been progressing in intensity. He had some facial pain.  Past Medical History:  Diagnosis Date  . Allergy   . 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 Mina MarbleWeingold   . Cataract   . Hyperlipidemia   . Nontraumatic tear of plantar fascia    right foot   . Sleep apnea, obstructive   . Tear of left biceps muscle    Past Surgical History:  Procedure Laterality Date  . CATARACT EXTRACTION, BILATERAL  2009  . COLONOSCOPY  2013   per Dr. Jeralene PetersGanim, clear, repeat in 10 yrs     reports that  has never smoked. he has never used smokeless tobacco. He reports that he does not drink alcohol or use drugs. family history includes Heart disease in his father and mother. No Known Allergies   Review of Systems  Constitutional: Positive for fatigue.  HENT: Positive for congestion, sinus pressure, sinus pain and sore throat.        Objective:   Physical Exam  Constitutional: He appears well-developed and well-nourished.  HENT:  Right Ear: External ear normal.  Left Ear: External ear normal.  Posterior pharynx moderate erythema. No exudate  Neck: Neck supple.  Cardiovascular: Normal rate and regular rhythm.  Pulmonary/Chest: Effort normal and breath sounds normal. No respiratory distress. He has no wheezes. He has no rales.  Lymphadenopathy:    He has no cervical adenopathy.       Assessment:     Upper respiratory infection. Clinically, doubt strep. Rapid strep negative. Patient requesting throat culture    Plan:     -Throat culture obtained -Treat symptomatically -Consider treatment with Augmentin if symptoms  persist or worsen  Kristian CoveyBruce W  MD [facility]

## 2017-07-14 NOTE — Patient Instructions (Signed)

## 2017-07-16 LAB — CULTURE, GROUP A STREP
MICRO NUMBER:: 90096648
SPECIMEN QUALITY:: ADEQUATE

## 2017-07-29 IMAGING — CR DG SHOULDER 2+V*L*
2 series · 2 of 2 positions shown · non-contrast
Comparison: None

CLINICAL DATA: Fall onto left shoulder, severe left shoulder pain

EXAM:
LEFT SHOULDER - 2+ VIEW

[AP (1 of 2)]
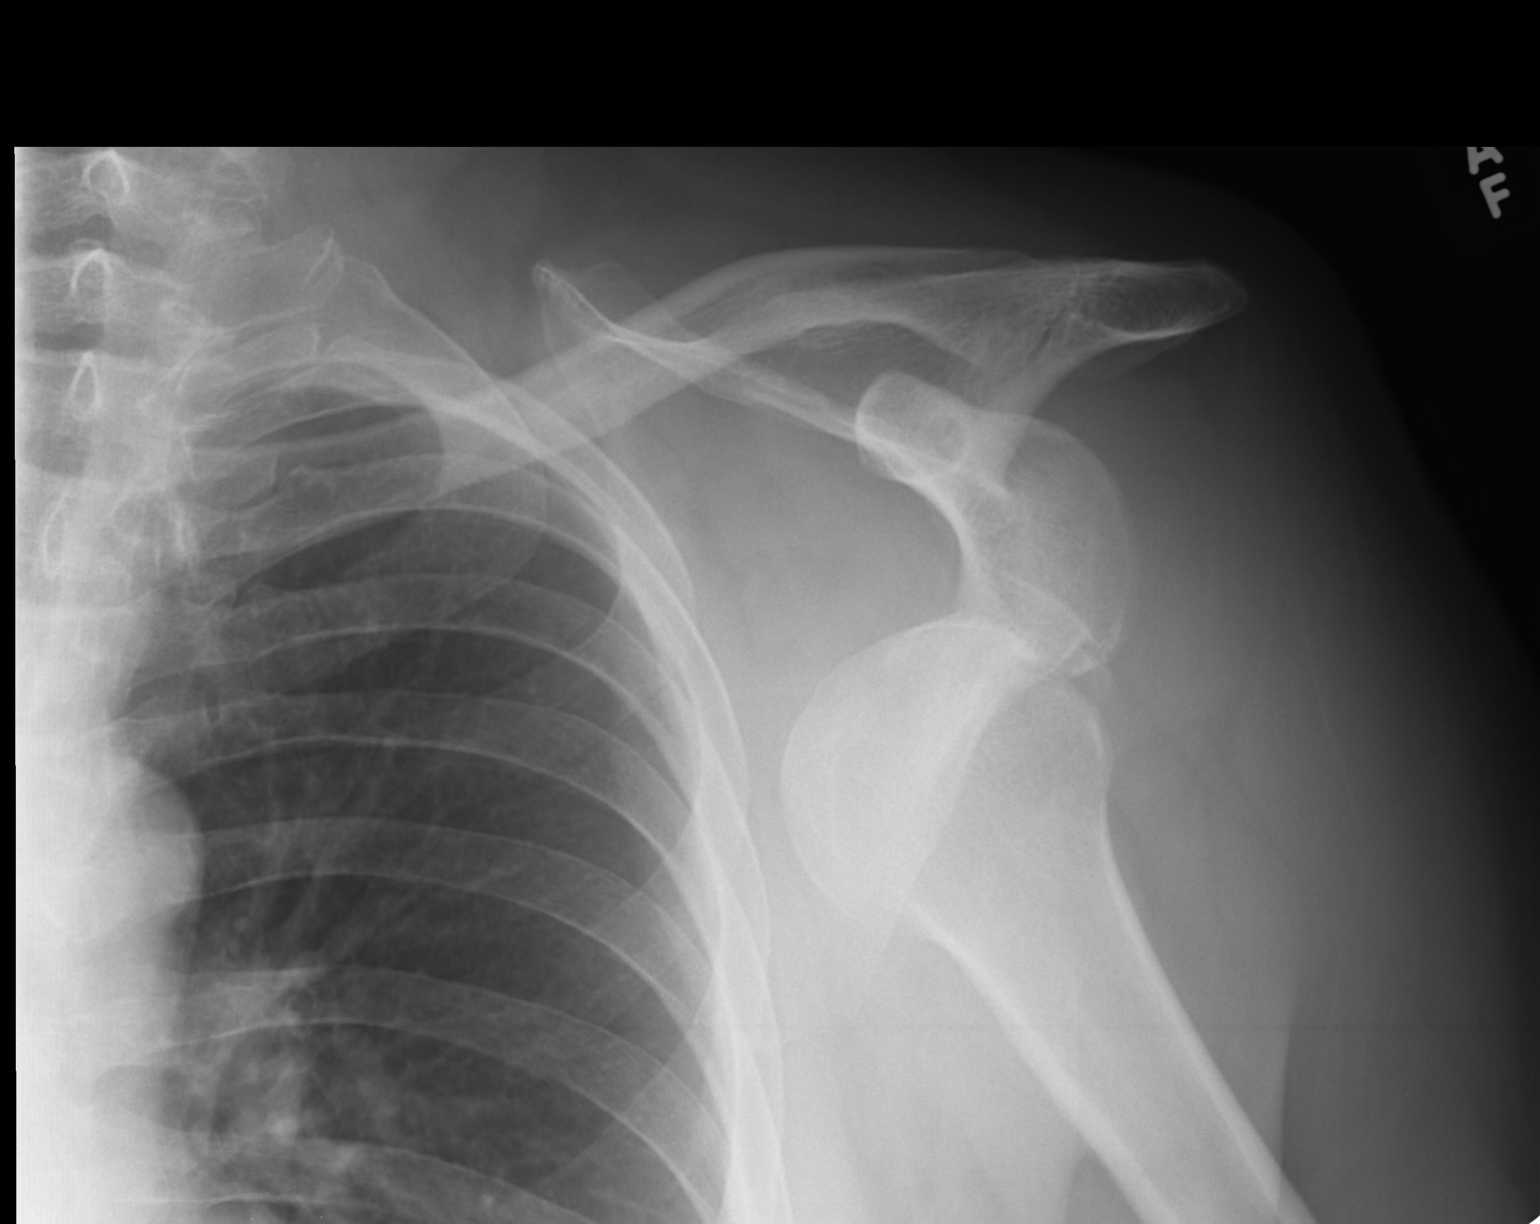

[AP (2 of 2)]
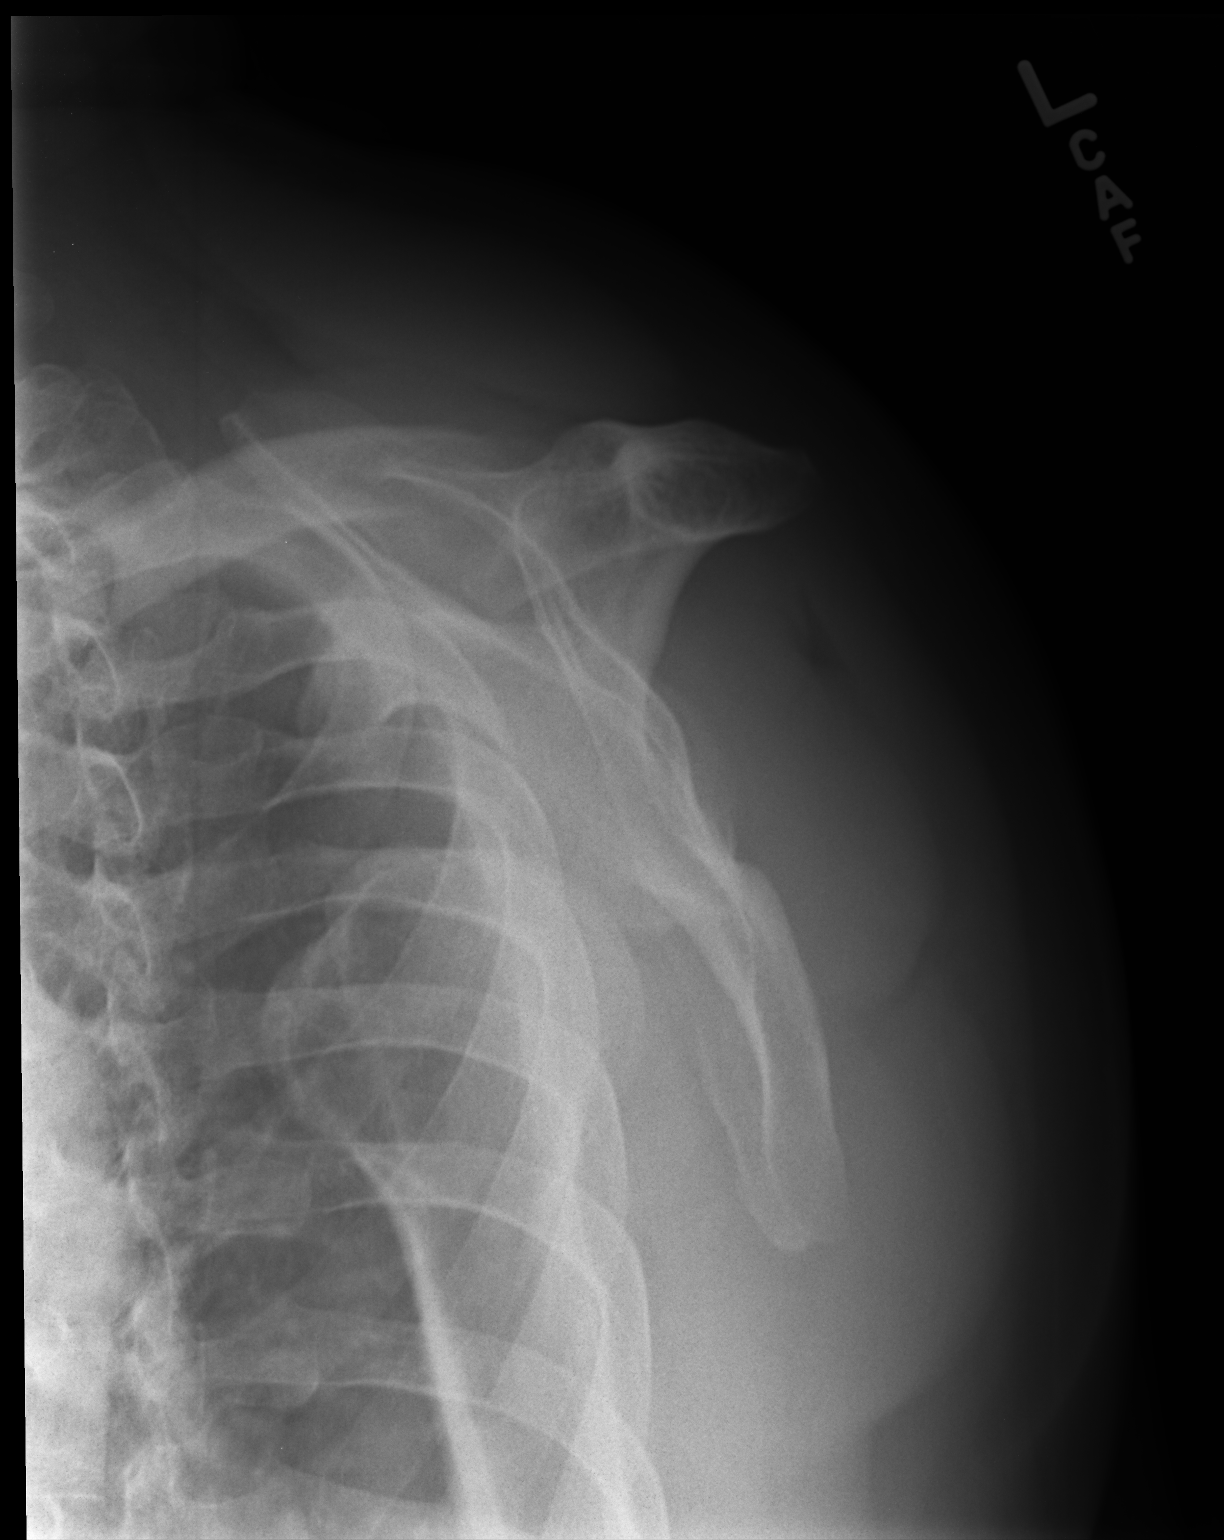

[2 of 2 positions shown; findings below may reference images not displayed]

FINDINGS: There is anterior left shoulder dislocation. Small bone fragments
are noted lateral to the humeral head, likely avulsed fragments.
IMPRESSION: Anterior left shoulder dislocation. Probable avulsed fragments along
lateral humeral head.

## 2017-11-11 ENCOUNTER — Ambulatory Visit (INDEPENDENT_AMBULATORY_CARE_PROVIDER_SITE_OTHER): Payer: 59 | Admitting: Family Medicine

## 2017-11-11 ENCOUNTER — Encounter: Payer: Self-pay | Admitting: Family Medicine

## 2017-11-11 VITALS — BP 118/68 | HR 52 | Temp 97.8°F | Ht 66.5 in | Wt 182.2 lb

## 2017-11-11 DIAGNOSIS — J069 Acute upper respiratory infection, unspecified: Secondary | ICD-10-CM

## 2017-11-11 MED ORDER — BENZONATATE 200 MG PO CAPS: 200.0000 mg | ORAL_CAPSULE | Freq: Two times a day (BID) | ORAL | 0 refills | Status: DC | PRN

## 2017-11-11 NOTE — Progress Notes (Signed)
   Subjective:    Patient ID: [name], male    DOB: 1951/01/13, 67 y.o.   MRN: [id number]  HPI Here for 10 days of stuffy head, PND, and a cough producing clear mucus. No fever. Using Nyquil at night. Today he actually feels much better.    Review of Systems  Constitutional: Negative.   HENT: Positive for congestion and postnasal drip. Negative for sinus pressure, sinus pain and sore throat.   Eyes: Negative.   Respiratory: Positive for cough.        Objective:   Physical Exam  Constitutional: He appears well-developed and well-nourished. No distress.  HENT:  Right Ear: External ear normal.  Left Ear: External ear normal.  Nose: Nose normal.  Mouth/Throat: Oropharynx is clear and moist.  Eyes: Conjunctivae are normal.  Neck: No thyromegaly present.  Pulmonary/Chest: Effort normal and breath sounds normal. No stridor. No respiratory distress. He has no wheezes. He has no rales.  Lymphadenopathy:    He has no cervical adenopathy.          Assessment & Plan:  Viral URI. Try Benzonatate for cough. Drink fluids. Recheck prn.  Gershon Crane, MD

## 2018-04-20 ENCOUNTER — Ambulatory Visit (INDEPENDENT_AMBULATORY_CARE_PROVIDER_SITE_OTHER): Payer: 59 | Admitting: Family Medicine

## 2018-04-20 ENCOUNTER — Encounter: Payer: Self-pay | Admitting: Family Medicine

## 2018-04-20 VITALS — BP 114/76 | HR 67 | Temp 97.8°F | Ht 66.0 in | Wt 175.2 lb

## 2018-04-20 DIAGNOSIS — Z23 Encounter for immunization: Secondary | ICD-10-CM

## 2018-04-20 DIAGNOSIS — Z0000 Encounter for general adult medical examination without abnormal findings: Secondary | ICD-10-CM

## 2018-04-20 LAB — CBC WITH DIFFERENTIAL/PLATELET
BASOS ABS: 0 10*3/uL (ref 0.0–0.1)
BASOS PCT: 0.4 % (ref 0.0–3.0)
EOS ABS: 0.1 10*3/uL (ref 0.0–0.7)
Eosinophils Relative: 0.6 % (ref 0.0–5.0)
HCT: 45.8 % (ref 39.0–52.0)
HEMOGLOBIN: 15.7 g/dL (ref 13.0–17.0)
Lymphocytes Relative: 16.7 % (ref 12.0–46.0)
Lymphs Abs: 1.5 10*3/uL (ref 0.7–4.0)
MCHC: 34.2 g/dL (ref 30.0–36.0)
MCV: 87.5 fl (ref 78.0–100.0)
MONOS PCT: 11.9 % (ref 3.0–12.0)
Monocytes Absolute: 1.1 10*3/uL — ABNORMAL HIGH (ref 0.1–1.0)
Neutro Abs: 6.2 10*3/uL (ref 1.4–7.7)
Neutrophils Relative %: 70.4 % (ref 43.0–77.0)
Platelets: 204 10*3/uL (ref 150.0–400.0)
RBC: 5.23 Mil/uL (ref 4.22–5.81)
RDW: 13.2 % (ref 11.5–15.5)
WBC: 8.9 10*3/uL (ref 4.0–10.5)

## 2018-04-20 LAB — HEPATIC FUNCTION PANEL
ALT: 23 U/L (ref 0–53)
AST: 18 U/L (ref 0–37)
Albumin: 5 g/dL (ref 3.5–5.2)
Alkaline Phosphatase: 49 U/L (ref 39–117)
BILIRUBIN DIRECT: 0.2 mg/dL (ref 0.0–0.3)
BILIRUBIN TOTAL: 1.1 mg/dL (ref 0.2–1.2)
Total Protein: 7.5 g/dL (ref 6.0–8.3)

## 2018-04-20 LAB — POC URINALSYSI DIPSTICK (AUTOMATED)
BILIRUBIN UA: NEGATIVE
GLUCOSE UA: NEGATIVE
Ketones, UA: NEGATIVE
NITRITE UA: NEGATIVE
Protein, UA: NEGATIVE
Spec Grav, UA: 1.01 (ref 1.010–1.025)
UROBILINOGEN UA: 0.2 U/dL
pH, UA: 6.5 (ref 5.0–8.0)

## 2018-04-20 LAB — LIPID PANEL
Cholesterol: 234 mg/dL — ABNORMAL HIGH (ref 0–200)
HDL: 45.6 mg/dL (ref >39.00)
LDL Cholesterol: 166 mg/dL — ABNORMAL HIGH (ref 0–99)
NONHDL: 188.87
Total CHOL/HDL Ratio: 5
Triglycerides: 112 mg/dL (ref 0.0–149.0)
VLDL: 22.4 mg/dL (ref 0.0–40.0)

## 2018-04-20 LAB — BASIC METABOLIC PANEL
BUN: 13 mg/dL (ref 6–23)
CHLORIDE: 99 meq/L (ref 96–112)
CO2: 29 meq/L (ref 19–32)
CREATININE: 0.99 mg/dL (ref 0.40–1.50)
Calcium: 10.3 mg/dL (ref 8.4–10.5)
GFR: 80.43 mL/min (ref >60.00)
Glucose, Bld: 93 mg/dL (ref 70–99)
POTASSIUM: 5 meq/L (ref 3.5–5.1)
Sodium: 138 mEq/L (ref 135–145)

## 2018-04-20 LAB — PSA: PSA: 2.3 ng/mL (ref 0.10–4.00)

## 2018-04-20 LAB — TSH: TSH: 1.53 u[IU]/mL (ref 0.35–4.50)

## 2018-04-20 NOTE — Progress Notes (Signed)
   Subjective:    Patient ID: John Soto, male    DOB: [birth year], 67 y.o.   MRN: [id number]  HPI Here for a well exam. He is feeling fine.    Review of Systems  Constitutional: Negative.   HENT: Negative.   Eyes: Negative.   Respiratory: Negative.   Cardiovascular: Negative.   Gastrointestinal: Negative.   Genitourinary: Negative.   Musculoskeletal: Negative.   Skin: Negative.   Neurological: Negative.   Psychiatric/Behavioral: Negative.        Objective:   Physical Exam  Constitutional: He is oriented to person, place, and time. He appears well-developed and well-nourished. No distress.  HENT:  Head: Normocephalic and atraumatic.  Right Ear: External ear normal.  Left Ear: External ear normal.  Nose: Nose normal.  Mouth/Throat: Oropharynx is clear and moist. No oropharyngeal exudate.  Eyes: Pupils are equal, round, and reactive to light. Conjunctivae and EOM are normal. Right eye exhibits no discharge. Left eye exhibits no discharge. No scleral icterus.  Neck: Neck supple. No JVD present. No tracheal deviation present. No thyromegaly present.  Cardiovascular: Normal rate, regular rhythm, normal heart sounds and intact distal pulses. Exam reveals no gallop and no friction rub.  No murmur heard. Pulmonary/Chest: Effort normal and breath sounds normal. No respiratory distress. He has no wheezes. He has no rales. He exhibits no tenderness.  Abdominal: Soft. Bowel sounds are normal. He exhibits no distension and no mass. There is no tenderness. There is no rebound and no guarding.  Genitourinary: Rectum normal, prostate normal and penis normal. Rectal exam shows guaiac negative stool. No penile tenderness.  Musculoskeletal: Normal range of motion. He exhibits no edema or tenderness.  Lymphadenopathy:    He has no cervical adenopathy.  Neurological: He is alert and oriented to person, place, and time. He has normal reflexes. He displays normal reflexes. No cranial nerve  deficit. He exhibits normal muscle tone. Coordination normal.  Skin: Skin is warm and dry. No rash noted. He is not diaphoretic. No erythema. No pallor.  Psychiatric: He has a normal mood and affect. His behavior is normal. Judgment and thought content normal.          Assessment & Plan:  Well exam. We discussed diet and exercise. Get fasting labs.  Gershon Crane, MD

## 2018-04-26 ENCOUNTER — Telehealth: Payer: Self-pay | Admitting: *Deleted

## 2018-04-26 DIAGNOSIS — E782 Mixed hyperlipidemia: Secondary | ICD-10-CM

## 2018-04-26 MED ORDER — ATORVASTATIN CALCIUM 10 MG PO TABS: 10.0000 mg | ORAL_TABLET | Freq: Every day | ORAL | 3 refills | Status: DC

## 2018-04-26 NOTE — Telephone Encounter (Signed)
Patient is aware.  Rx sent.  Lab appointment and orders placed.

## 2018-04-26 NOTE — Telephone Encounter (Signed)
Copied from CRM [id number]. Topic: General - Other >> Apr 26, 2018  8:14 AM Ronney Lion A wrote: Reason for CRM: pt called in wanting to know the results of his labs that was done on 04/20/18.   Please advise

## 2018-08-04 ENCOUNTER — Other Ambulatory Visit (INDEPENDENT_AMBULATORY_CARE_PROVIDER_SITE_OTHER): Payer: 59

## 2018-08-04 DIAGNOSIS — E782 Mixed hyperlipidemia: Secondary | ICD-10-CM

## 2018-08-04 LAB — HEPATIC FUNCTION PANEL
ALT: 23 U/L (ref 0–53)
AST: 17 U/L (ref 0–37)
Albumin: 4.8 g/dL (ref 3.5–5.2)
Alkaline Phosphatase: 43 U/L (ref 39–117)
Bilirubin, Direct: 0.2 mg/dL (ref 0.0–0.3)
TOTAL PROTEIN: 7.2 g/dL (ref 6.0–8.3)
Total Bilirubin: 1.1 mg/dL (ref 0.2–1.2)

## 2018-08-04 LAB — LIPID PANEL
Cholesterol: 195 mg/dL (ref 0–200)
HDL: 49.2 mg/dL (ref >39.00)
LDL CALC: 119 mg/dL — AB (ref 0–99)
NonHDL: 145.45
Total CHOL/HDL Ratio: 4
Triglycerides: 131 mg/dL (ref 0.0–149.0)
VLDL: 26.2 mg/dL (ref 0.0–40.0)

## 2018-08-08 ENCOUNTER — Encounter: Payer: Self-pay | Admitting: *Deleted

## 2018-08-08 ENCOUNTER — Other Ambulatory Visit: Payer: Self-pay | Admitting: Family Medicine

## 2018-08-08 MED ORDER — ATORVASTATIN CALCIUM 20 MG PO TABS: 20.0000 mg | ORAL_TABLET | Freq: Every day | ORAL | 3 refills | Status: DC

## 2019-08-31 ENCOUNTER — Other Ambulatory Visit: Payer: Self-pay | Admitting: Family Medicine

## 2019-10-04 ENCOUNTER — Other Ambulatory Visit: Payer: Self-pay

## 2019-10-05 ENCOUNTER — Encounter: Payer: Self-pay | Admitting: Family Medicine

## 2019-10-05 ENCOUNTER — Ambulatory Visit (INDEPENDENT_AMBULATORY_CARE_PROVIDER_SITE_OTHER): Payer: 59 | Admitting: Family Medicine

## 2019-10-05 VITALS — BP 140/68 | HR 62 | Temp 97.5°F | Wt 177.8 lb

## 2019-10-05 DIAGNOSIS — R739 Hyperglycemia, unspecified: Secondary | ICD-10-CM | POA: Diagnosis not present

## 2019-10-05 DIAGNOSIS — Z0000 Encounter for general adult medical examination without abnormal findings: Secondary | ICD-10-CM | POA: Diagnosis not present

## 2019-10-05 LAB — CBC WITH DIFFERENTIAL/PLATELET
Basophils Absolute: 0 10*3/uL (ref 0.0–0.1)
Basophils Relative: 0.8 % (ref 0.0–3.0)
Eosinophils Absolute: 0.1 10*3/uL (ref 0.0–0.7)
Eosinophils Relative: 1.5 % (ref 0.0–5.0)
HCT: 46.4 % (ref 39.0–52.0)
Hemoglobin: 15.8 g/dL (ref 13.0–17.0)
Lymphocytes Relative: 32.5 % (ref 12.0–46.0)
Lymphs Abs: 1.4 10*3/uL (ref 0.7–4.0)
MCHC: 33.9 g/dL (ref 30.0–36.0)
MCV: 87.7 fl (ref 78.0–100.0)
Monocytes Absolute: 0.4 10*3/uL (ref 0.1–1.0)
Monocytes Relative: 10.5 % (ref 3.0–12.0)
Neutro Abs: 2.3 10*3/uL (ref 1.4–7.7)
Neutrophils Relative %: 54.7 % (ref 43.0–77.0)
Platelets: 200 10*3/uL (ref 150.0–400.0)
RBC: 5.3 Mil/uL (ref 4.22–5.81)
RDW: 13.7 % (ref 11.5–15.5)
WBC: 4.3 10*3/uL (ref 4.0–10.5)

## 2019-10-05 LAB — HEPATIC FUNCTION PANEL
ALT: 24 U/L (ref 0–53)
AST: 16 U/L (ref 0–37)
Albumin: 4.7 g/dL (ref 3.5–5.2)
Alkaline Phosphatase: 46 U/L (ref 39–117)
Bilirubin, Direct: 0.2 mg/dL (ref 0.0–0.3)
Total Bilirubin: 1.3 mg/dL — ABNORMAL HIGH (ref 0.2–1.2)
Total Protein: 6.9 g/dL (ref 6.0–8.3)

## 2019-10-05 LAB — BASIC METABOLIC PANEL
BUN: 17 mg/dL (ref 6–23)
CO2: 29 mEq/L (ref 19–32)
Calcium: 9.7 mg/dL (ref 8.4–10.5)
Chloride: 103 mEq/L (ref 96–112)
Creatinine, Ser: 0.99 mg/dL (ref 0.40–1.50)
GFR: 75.34 mL/min (ref >60.00)
Glucose, Bld: 97 mg/dL (ref 70–99)
Potassium: 4.8 mEq/L (ref 3.5–5.1)
Sodium: 139 mEq/L (ref 135–145)

## 2019-10-05 LAB — LIPID PANEL
Cholesterol: 166 mg/dL (ref 0–200)
HDL: 41.9 mg/dL (ref >39.00)
LDL Cholesterol: 109 mg/dL — ABNORMAL HIGH (ref 0–99)
NonHDL: 124.37
Total CHOL/HDL Ratio: 4
Triglycerides: 76 mg/dL (ref 0.0–149.0)
VLDL: 15.2 mg/dL (ref 0.0–40.0)

## 2019-10-05 LAB — PSA: PSA: 2.2 ng/mL (ref 0.10–4.00)

## 2019-10-05 LAB — TSH: TSH: 1.52 u[IU]/mL (ref 0.35–4.50)

## 2019-10-05 LAB — HEMOGLOBIN A1C: Hgb A1c MFr Bld: 6.1 % (ref 4.6–6.5)

## 2019-10-05 NOTE — Progress Notes (Signed)
Subjective:    Patient ID: John Soto, male    DOB: 1950/08/02, 69 y.o.   MRN: [id number]  HPI Here for a well exam. He has a few questions. First he thinks he has wax in his ears, but he denies any trouble hearing. There is a spot on the left ear lobe he wants me to check. otherwsie he feels great. He walks 5-6 miles every day.    Review of Systems  Constitutional: Negative.   HENT: Negative.   Eyes: Negative.   Respiratory: Negative.   Cardiovascular: Negative.   Gastrointestinal: Negative.   Genitourinary: Negative.   Musculoskeletal: Negative.   Skin: Negative.   Neurological: Negative.   Psychiatric/Behavioral: Negative.        Objective:   Physical Exam Constitutional:      General: He is not in acute distress.    Appearance: He is well-developed. He is not diaphoretic.  HENT:     Head: Normocephalic and atraumatic.     Ears:     Comments: Both ear canals have some cerumen present. The left ear lobe has a 6 mm lesion which is dark brown and crusty and raised     Nose: Nose normal.     Mouth/Throat:     Pharynx: No oropharyngeal exudate.  Eyes:     General: No scleral icterus.       Right eye: No discharge.        Left eye: No discharge.     Conjunctiva/sclera: Conjunctivae normal.     Pupils: Pupils are equal, round, and reactive to light.  Neck:     Thyroid: No thyromegaly.     Vascular: No JVD.     Trachea: No tracheal deviation.  Cardiovascular:     Rate and Rhythm: Normal rate and regular rhythm.     Heart sounds: Normal heart sounds. No murmur. No friction rub. No gallop.   Pulmonary:     Effort: Pulmonary effort is normal. No respiratory distress.     Breath sounds: Normal breath sounds. No wheezing or rales.  Chest:     Chest wall: No tenderness.  Abdominal:     General: Bowel sounds are normal. There is no distension.     Palpations: Abdomen is soft. There is no mass.     Tenderness: There is no abdominal tenderness. There is no guarding or  rebound.  Genitourinary:    Penis: Normal. No tenderness.      Testes: Normal.     Prostate: Normal.     Rectum: Normal. Guaiac result negative.  Musculoskeletal:        General: No tenderness. Normal range of motion.     Cervical back: Neck supple.  Lymphadenopathy:     Cervical: No cervical adenopathy.  Skin:    General: Skin is warm and dry.     Coloration: Skin is not pale.     Findings: No erythema or rash.  Neurological:     Mental Status: He is alert and oriented to person, place, and time.     Cranial Nerves: No cranial nerve deficit.     Motor: No abnormal muscle tone.     Coordination: Coordination normal.     Deep Tendon Reflexes: Reflexes are normal and symmetric. Reflexes normal.  Psychiatric:        Behavior: Behavior normal.        Thought Content: Thought content normal.        Judgment: Judgment normal.  Assessment & Plan:  Well exam. We discussed diet and exercise. Get fasting labs. The ear cerumen was irrigated clear with water. The lesion on the left ear appears to be a seborrheic keratosis, but this is a very unusual location for one of these. It so happens that he will be seeing his dermatologist, Dr. Donzetta Starch, this afternoon, and I asked him to be sure to show this to him.  Gershon Crane, MD

## 2020-01-16 ENCOUNTER — Encounter: Payer: Self-pay | Admitting: Family Medicine

## 2020-01-17 NOTE — Telephone Encounter (Signed)
I cannot open the attachment, please see if you can

## 2020-01-23 NOTE — Telephone Encounter (Signed)
I am happy to help with this. I would like to set up an OV (either in person or virtual) after he brings [place] the form. That way we can fill it out together and make sure we are on the same page

## 2020-01-24 NOTE — Telephone Encounter (Signed)
Yes I finally have the form. Please set up a virtual visit with him so we can fill this out together

## 2020-01-30 ENCOUNTER — Other Ambulatory Visit: Payer: Self-pay

## 2020-01-30 ENCOUNTER — Ambulatory Visit (INDEPENDENT_AMBULATORY_CARE_PROVIDER_SITE_OTHER): Payer: 59 | Admitting: Family Medicine

## 2020-01-30 ENCOUNTER — Encounter: Payer: Self-pay | Admitting: Family Medicine

## 2020-01-30 VITALS — BP 120/60 | HR 69 | Temp 98.3°F | Wt 176.6 lb

## 2020-01-30 DIAGNOSIS — Z209 Contact with and (suspected) exposure to unspecified communicable disease: Secondary | ICD-10-CM | POA: Diagnosis not present

## 2020-01-30 NOTE — Progress Notes (Signed)
   Subjective:    Patient ID: John Soto, male    DOB: [birth year], 68 y.o.   MRN: [id number]  HPI Here to fill out forms for his job to allow him to continue to work virtually from home. We both agree that potential exposure to the Covid-19 virus at his office would cause so much stress that this would pose him harm.    Review of Systems  Constitutional: Negative.   Respiratory: Negative.   Cardiovascular: Negative.        Objective:   Physical Exam Constitutional:      Appearance: Normal appearance.  Cardiovascular:     Rate and Rhythm: Normal rate and regular rhythm.     Pulses: Normal pulses.     Heart sounds: Normal heart sounds.  Pulmonary:     Effort: Pulmonary effort is normal.     Breath sounds: Normal breath sounds.  Neurological:     Mental Status: He is alert.           Assessment & Plan:  Potential exposure to infectious disease. We filled out his forms to allow him to work from home.  Gershon Crane, MD

## 2020-01-31 ENCOUNTER — Encounter: Payer: Self-pay | Admitting: Family Medicine

## 2020-10-24 ENCOUNTER — Encounter: Payer: Self-pay | Admitting: Family Medicine

## 2020-10-25 NOTE — Telephone Encounter (Signed)
Please set up an OV so we can fill these out together

## 2020-11-08 ENCOUNTER — Other Ambulatory Visit: Payer: Self-pay

## 2020-11-11 ENCOUNTER — Other Ambulatory Visit: Payer: Self-pay

## 2020-11-11 ENCOUNTER — Ambulatory Visit (INDEPENDENT_AMBULATORY_CARE_PROVIDER_SITE_OTHER): Payer: 59 | Admitting: Family Medicine

## 2020-11-11 ENCOUNTER — Encounter: Payer: Self-pay | Admitting: Family Medicine

## 2020-11-11 VITALS — BP 106/68 | HR 66 | Temp 98.3°F | Wt 180.0 lb

## 2020-11-11 DIAGNOSIS — Z209 Contact with and (suspected) exposure to unspecified communicable disease: Secondary | ICD-10-CM

## 2020-11-11 DIAGNOSIS — Z0289 Encounter for other administrative examinations: Secondary | ICD-10-CM

## 2020-11-11 NOTE — Progress Notes (Signed)
   Subjective:    Patient ID: John Soto, male    DOB: February 20, 1951, 70 y.o.   MRN: [id number]  HPI  Here for assistance with a work form, like we did a year ago. He has great concerns about his health should he be exposed to the Covid-19 virus, and he has been working exclusively from home. He wants to continue to so so.   Review of Systems  Constitutional: Negative.   HENT: Negative.   Eyes: Negative.   Respiratory: Negative.   Cardiovascular: Negative.   Gastrointestinal: Negative.   Genitourinary: Negative.   Musculoskeletal: Negative.   Skin: Negative.   Neurological: Negative.   Psychiatric/Behavioral: Negative.        Objective:   Physical Exam Constitutional:      Appearance: Normal appearance.  Cardiovascular:     Rate and Rhythm: Normal rate and regular rhythm.     Pulses: Normal pulses.     Heart sounds: Normal heart sounds.  Pulmonary:     Effort: Pulmonary effort is normal.     Breath sounds: Normal breath sounds.  Neurological:     Mental Status: He is alert.           Assessment & Plan:  He is doing well. We filled out the form together to allow him to work remotely from home.  Gershon Crane, MD

## 2020-11-12 ENCOUNTER — Other Ambulatory Visit: Payer: Self-pay | Admitting: Family Medicine

## 2020-11-12 NOTE — Telephone Encounter (Signed)
Last office visit-11/11/20 Last labs-10/05/19 Last refill- 08/31/19--30 tabs, 11 refills  No future visit has been scheduled

## 2020-11-13 ENCOUNTER — Telehealth: Payer: Self-pay

## 2020-11-13 NOTE — Telephone Encounter (Signed)
Give him a 30 day supply, but he needs to set up a well exam and fasting labs

## 2020-11-13 NOTE — Telephone Encounter (Signed)
30 day supply sent to pharmacy for Atorvastatin, per Dr. Clent Ridges.   Patient is aware to schedule physical with fasting labs, per Dr. Clent Ridges request.

## 2020-12-16 ENCOUNTER — Other Ambulatory Visit: Payer: Self-pay

## 2020-12-17 ENCOUNTER — Ambulatory Visit (INDEPENDENT_AMBULATORY_CARE_PROVIDER_SITE_OTHER): Payer: 59 | Admitting: Family Medicine

## 2020-12-17 ENCOUNTER — Encounter: Payer: Self-pay | Admitting: Family Medicine

## 2020-12-17 VITALS — BP 110/70 | HR 56 | Temp 98.0°F | Ht 66.5 in | Wt 176.0 lb

## 2020-12-17 DIAGNOSIS — Z0000 Encounter for general adult medical examination without abnormal findings: Secondary | ICD-10-CM

## 2020-12-17 DIAGNOSIS — Z125 Encounter for screening for malignant neoplasm of prostate: Secondary | ICD-10-CM | POA: Diagnosis not present

## 2020-12-17 DIAGNOSIS — Z23 Encounter for immunization: Secondary | ICD-10-CM

## 2020-12-17 LAB — HEPATIC FUNCTION PANEL
ALT: 21 U/L (ref 0–53)
AST: 18 U/L (ref 0–37)
Albumin: 4.6 g/dL (ref 3.5–5.2)
Alkaline Phosphatase: 45 U/L (ref 39–117)
Bilirubin, Direct: 0.2 mg/dL (ref 0.0–0.3)
Total Bilirubin: 1.3 mg/dL — ABNORMAL HIGH (ref 0.2–1.2)
Total Protein: 6.8 g/dL (ref 6.0–8.3)

## 2020-12-17 LAB — CBC WITH DIFFERENTIAL/PLATELET
Basophils Absolute: 0 10*3/uL (ref 0.0–0.1)
Basophils Relative: 0.7 % (ref 0.0–3.0)
Eosinophils Absolute: 0.1 10*3/uL (ref 0.0–0.7)
Eosinophils Relative: 1 % (ref 0.0–5.0)
HCT: 45.4 % (ref 39.0–52.0)
Hemoglobin: 15.2 g/dL (ref 13.0–17.0)
Lymphocytes Relative: 21.2 % (ref 12.0–46.0)
Lymphs Abs: 1.1 10*3/uL (ref 0.7–4.0)
MCHC: 33.6 g/dL (ref 30.0–36.0)
MCV: 87.3 fl (ref 78.0–100.0)
Monocytes Absolute: 0.5 10*3/uL (ref 0.1–1.0)
Monocytes Relative: 8.7 % (ref 3.0–12.0)
Neutro Abs: 3.7 10*3/uL (ref 1.4–7.7)
Neutrophils Relative %: 68.4 % (ref 43.0–77.0)
Platelets: 195 10*3/uL (ref 150.0–400.0)
RBC: 5.2 Mil/uL (ref 4.22–5.81)
RDW: 13.8 % (ref 11.5–15.5)
WBC: 5.4 10*3/uL (ref 4.0–10.5)

## 2020-12-17 LAB — LIPID PANEL
Cholesterol: 152 mg/dL (ref 0–200)
HDL: 46 mg/dL (ref >39.00)
LDL Cholesterol: 90 mg/dL (ref 0–99)
NonHDL: 106.01
Total CHOL/HDL Ratio: 3
Triglycerides: 81 mg/dL (ref 0.0–149.0)
VLDL: 16.2 mg/dL (ref 0.0–40.0)

## 2020-12-17 LAB — T3, FREE: T3, Free: 3.9 pg/mL (ref 2.3–4.2)

## 2020-12-17 LAB — HEMOGLOBIN A1C: Hgb A1c MFr Bld: 6.3 % (ref 4.6–6.5)

## 2020-12-17 LAB — BASIC METABOLIC PANEL
BUN: 13 mg/dL (ref 6–23)
CO2: 25 mEq/L (ref 19–32)
Calcium: 9.6 mg/dL (ref 8.4–10.5)
Chloride: 104 mEq/L (ref 96–112)
Creatinine, Ser: 1.05 mg/dL (ref 0.40–1.50)
GFR: 72.95 mL/min (ref >60.00)
Glucose, Bld: 99 mg/dL (ref 70–99)
Potassium: 4.5 mEq/L (ref 3.5–5.1)
Sodium: 139 mEq/L (ref 135–145)

## 2020-12-17 LAB — TSH: TSH: 1.05 u[IU]/mL (ref 0.35–4.50)

## 2020-12-17 LAB — PSA: PSA: 1.7 ng/mL (ref 0.10–4.00)

## 2020-12-17 LAB — T4, FREE: Free T4: 0.92 ng/dL (ref 0.60–1.60)

## 2020-12-17 MED ORDER — ATORVASTATIN CALCIUM 20 MG PO TABS: 20.0000 mg | ORAL_TABLET | Freq: Every day | ORAL | 3 refills | Status: DC

## 2020-12-17 NOTE — Addendum Note (Signed)
Addended by: Kandra Nicolas on: 12/17/2020 10:27 AM   Modules accepted: Orders

## 2020-12-17 NOTE — Addendum Note (Signed)
Addended by: Carola Rhine on: 12/17/2020 11:54 AM   Modules accepted: Orders

## 2020-12-17 NOTE — Progress Notes (Signed)
Subjective:    Patient ID: John Soto, male    DOB: 06/07/1951, 70 y.o.   MRN: [id number]  HPI Here for a well exam. He feels well in general. He does mention some decreased hearing and discomfort in the right ear for the past few weeks.    Review of Systems  Constitutional: Negative.   HENT:  Positive for ear pain and hearing loss.   Eyes: Negative.   Respiratory: Negative.    Cardiovascular: Negative.   Gastrointestinal: Negative.   Genitourinary: Negative.   Musculoskeletal: Negative.   Skin: Negative.   Neurological: Negative.   Psychiatric/Behavioral: Negative.        Objective:   Physical Exam Constitutional:      General: He is not in acute distress.    Appearance: Normal appearance. He is well-developed. He is not diaphoretic.  HENT:     Head: Normocephalic and atraumatic.     Right Ear: External ear normal. There is impacted cerumen.     Left Ear: External ear normal. There is impacted cerumen.     Nose: Nose normal.     Mouth/Throat:     Pharynx: No oropharyngeal exudate.  Eyes:     General: No scleral icterus.       Right eye: No discharge.        Left eye: No discharge.     Conjunctiva/sclera: Conjunctivae normal.     Pupils: Pupils are equal, round, and reactive to light.  Neck:     Thyroid: No thyromegaly.     Vascular: No JVD.     Trachea: No tracheal deviation.  Cardiovascular:     Rate and Rhythm: Normal rate and regular rhythm.     Heart sounds: Normal heart sounds. No murmur heard.   No friction rub. No gallop.  Pulmonary:     Effort: Pulmonary effort is normal. No respiratory distress.     Breath sounds: Normal breath sounds. No wheezing or rales.  Chest:     Chest wall: No tenderness.  Abdominal:     General: Bowel sounds are normal. There is no distension.     Palpations: Abdomen is soft. There is no mass.     Tenderness: There is no abdominal tenderness. There is no guarding or rebound.  Genitourinary:    Penis: Normal. No  tenderness.      Testes: Normal.     Prostate: Normal.     Rectum: Normal. Guaiac result negative.  Musculoskeletal:        General: No tenderness. Normal range of motion.     Cervical back: Neck supple.  Lymphadenopathy:     Cervical: No cervical adenopathy.  Skin:    General: Skin is warm and dry.     Coloration: Skin is not pale.     Findings: No erythema or rash.  Neurological:     Mental Status: He is alert and oriented to person, place, and time.     Cranial Nerves: No cranial nerve deficit.     Motor: No abnormal muscle tone.     Coordination: Coordination normal.     Deep Tendon Reflexes: Reflexes are normal and symmetric. Reflexes normal.  Psychiatric:        Behavior: Behavior normal.        Thought Content: Thought content normal.        Judgment: Judgment normal.          Assessment & Plan:  Well exam. We discussed diet and exercise. Get fasting labs.  We obtained informed consent to irrigate his ear canals with water. This was done successfully. He heard better afterwards and he tolerated the procedure well.  Gershon Crane, MD

## 2021-02-10 ENCOUNTER — Other Ambulatory Visit: Payer: Self-pay

## 2021-02-10 ENCOUNTER — Ambulatory Visit (INDEPENDENT_AMBULATORY_CARE_PROVIDER_SITE_OTHER): Payer: 59 | Admitting: Family Medicine

## 2021-02-10 ENCOUNTER — Encounter: Payer: Self-pay | Admitting: Family Medicine

## 2021-02-10 VITALS — BP 108/62 | HR 66 | Temp 98.4°F | Ht 66.5 in | Wt 188.0 lb

## 2021-02-10 DIAGNOSIS — F419 Anxiety disorder, unspecified: Secondary | ICD-10-CM

## 2021-02-10 NOTE — Progress Notes (Signed)
   Subjective:    Patient ID: John Soto, male    DOB: 1951/04/06, 70 y.o.   MRN: [id number]  HPI Here for help with documentation for his employer to allow him to continue to work remotely from home. He has been doing this for more than a year. His employer has asked for details about his conditions (particularly his anxiety) and why he should work at home.    Review of Systems  Constitutional: Negative.   Respiratory: Negative.    Cardiovascular: Negative.       Objective:   Physical Exam Constitutional:      Appearance: Normal appearance.  Cardiovascular:     Rate and Rhythm: Normal rate and regular rhythm.     Pulses: Normal pulses.     Heart sounds: Normal heart sounds.  Pulmonary:     Effort: Pulmonary effort is normal.     Breath sounds: Normal breath sounds.  Neurological:     Mental Status: He is alert.  Psychiatric:        Mood and Affect: Mood normal.        Behavior: Behavior normal.        Thought Content: Thought content normal.        Judgment: Judgment normal.          Assessment & Plan:  We completed the form together to allow him to work remotely at home. By doing so, he has been able to control his anxiety quite well.  Gershon Crane, MD

## 2021-02-12 ENCOUNTER — Encounter: Payer: Self-pay | Admitting: Family Medicine

## 2021-12-28 ENCOUNTER — Other Ambulatory Visit: Payer: Self-pay | Admitting: Family Medicine

## 2021-12-28 ENCOUNTER — Encounter: Payer: Self-pay | Admitting: Family Medicine

## 2021-12-29 NOTE — Telephone Encounter (Signed)
Yes I would still support his position at work. He can set up a well exam sometime soon, and we can discuss this at the same visit.

## 2022-01-18 ENCOUNTER — Encounter: Payer: Self-pay | Admitting: Family Medicine

## 2022-01-20 ENCOUNTER — Ambulatory Visit (INDEPENDENT_AMBULATORY_CARE_PROVIDER_SITE_OTHER): Payer: 59 | Admitting: Family Medicine

## 2022-01-20 ENCOUNTER — Encounter: Payer: Self-pay | Admitting: Family Medicine

## 2022-01-20 VITALS — BP 112/70 | HR 62 | Temp 98.5°F | Ht 66.5 in | Wt 175.0 lb

## 2022-01-20 DIAGNOSIS — Z0000 Encounter for general adult medical examination without abnormal findings: Secondary | ICD-10-CM | POA: Diagnosis not present

## 2022-01-20 DIAGNOSIS — N3000 Acute cystitis without hematuria: Secondary | ICD-10-CM | POA: Diagnosis not present

## 2022-01-20 LAB — URINALYSIS, ROUTINE W REFLEX MICROSCOPIC
Bilirubin Urine: NEGATIVE
Hgb urine dipstick: NEGATIVE
Ketones, ur: NEGATIVE
Nitrite: NEGATIVE
Specific Gravity, Urine: 1.025 (ref 1.000–1.030)
Total Protein, Urine: NEGATIVE
Urine Glucose: NEGATIVE
Urobilinogen, UA: 0.2 (ref 0.0–1.0)
pH: 6 (ref 5.0–8.0)

## 2022-01-20 LAB — CBC WITH DIFFERENTIAL/PLATELET
Basophils Absolute: 0 10*3/uL (ref 0.0–0.1)
Basophils Relative: 0.8 % (ref 0.0–3.0)
Eosinophils Absolute: 0.1 10*3/uL (ref 0.0–0.7)
Eosinophils Relative: 1.4 % (ref 0.0–5.0)
HCT: 47.9 % (ref 39.0–52.0)
Hemoglobin: 15.9 g/dL (ref 13.0–17.0)
Lymphocytes Relative: 27.1 % (ref 12.0–46.0)
Lymphs Abs: 1.3 10*3/uL (ref 0.7–4.0)
MCHC: 33.2 g/dL (ref 30.0–36.0)
MCV: 88.4 fl (ref 78.0–100.0)
Monocytes Absolute: 0.4 10*3/uL (ref 0.1–1.0)
Monocytes Relative: 8.4 % (ref 3.0–12.0)
Neutro Abs: 2.9 10*3/uL (ref 1.4–7.7)
Neutrophils Relative %: 62.3 % (ref 43.0–77.0)
Platelets: 185 10*3/uL (ref 150.0–400.0)
RBC: 5.42 Mil/uL (ref 4.22–5.81)
RDW: 13.8 % (ref 11.5–15.5)
WBC: 4.7 10*3/uL (ref 4.0–10.5)

## 2022-01-20 LAB — BASIC METABOLIC PANEL
BUN: 17 mg/dL (ref 6–23)
CO2: 30 mEq/L (ref 19–32)
Calcium: 9.7 mg/dL (ref 8.4–10.5)
Chloride: 104 mEq/L (ref 96–112)
Creatinine, Ser: 1.02 mg/dL (ref 0.40–1.50)
GFR: 74.95 mL/min (ref >60.00)
Glucose, Bld: 100 mg/dL — ABNORMAL HIGH (ref 70–99)
Potassium: 4.7 mEq/L (ref 3.5–5.1)
Sodium: 141 mEq/L (ref 135–145)

## 2022-01-20 LAB — LIPID PANEL
Cholesterol: 155 mg/dL (ref 0–200)
HDL: 49.3 mg/dL (ref >39.00)
LDL Cholesterol: 89 mg/dL (ref 0–99)
NonHDL: 106.1
Total CHOL/HDL Ratio: 3
Triglycerides: 84 mg/dL (ref 0.0–149.0)
VLDL: 16.8 mg/dL (ref 0.0–40.0)

## 2022-01-20 LAB — HEPATIC FUNCTION PANEL
ALT: 21 U/L (ref 0–53)
AST: 18 U/L (ref 0–37)
Albumin: 4.8 g/dL (ref 3.5–5.2)
Alkaline Phosphatase: 39 U/L (ref 39–117)
Bilirubin, Direct: 0.1 mg/dL (ref 0.0–0.3)
Total Bilirubin: 0.8 mg/dL (ref 0.2–1.2)
Total Protein: 7.3 g/dL (ref 6.0–8.3)

## 2022-01-20 LAB — TSH: TSH: 1.64 u[IU]/mL (ref 0.35–5.50)

## 2022-01-20 LAB — PSA: PSA: 1.62 ng/mL (ref 0.10–4.00)

## 2022-01-20 LAB — HEMOGLOBIN A1C: Hgb A1c MFr Bld: 6.3 % (ref 4.6–6.5)

## 2022-01-20 MED ORDER — SILDENAFIL CITRATE 100 MG PO TABS: 100.0000 mg | ORAL_TABLET | Freq: Every day | ORAL | 3 refills | Status: DC | PRN

## 2022-01-20 MED ORDER — ATORVASTATIN CALCIUM 20 MG PO TABS
20.0000 mg | ORAL_TABLET | Freq: Every day | ORAL | 3 refills | Status: DC
Start: 2022-01-20 — End: 2023-04-30

## 2022-01-20 NOTE — Progress Notes (Signed)
   Subjective:    Patient ID: John Soto, male    DOB: 12/16/1950, 71 y.o.   MRN: [id number]  HPI Here for a well exam. He feels fine.    Review of Systems  Constitutional: Negative.   HENT: Negative.    Eyes: Negative.   Respiratory: Negative.    Cardiovascular: Negative.   Gastrointestinal: Negative.   Genitourinary: Negative.   Musculoskeletal: Negative.   Skin: Negative.   Neurological: Negative.   Psychiatric/Behavioral: Negative.         Objective:   Physical Exam Constitutional:      General: He is not in acute distress.    Appearance: Normal appearance. He is well-developed. He is not diaphoretic.  HENT:     Head: Normocephalic and atraumatic.     Right Ear: External ear normal.     Left Ear: External ear normal.     Nose: Nose normal.     Mouth/Throat:     Pharynx: No oropharyngeal exudate.  Eyes:     General: No scleral icterus.       Right eye: No discharge.        Left eye: No discharge.     Conjunctiva/sclera: Conjunctivae normal.     Pupils: Pupils are equal, round, and reactive to light.  Neck:     Thyroid: No thyromegaly.     Vascular: No JVD.     Trachea: No tracheal deviation.  Cardiovascular:     Rate and Rhythm: Normal rate and regular rhythm.     Heart sounds: Normal heart sounds. No murmur heard.    No friction rub. No gallop.  Pulmonary:     Effort: Pulmonary effort is normal. No respiratory distress.     Breath sounds: Normal breath sounds. No wheezing or rales.  Chest:     Chest wall: No tenderness.  Abdominal:     General: Bowel sounds are normal. There is no distension.     Palpations: Abdomen is soft. There is no mass.     Tenderness: There is no abdominal tenderness. There is no guarding or rebound.  Genitourinary:    Penis: Normal. No tenderness.      Testes: Normal.     Prostate: Normal.     Rectum: Normal. Guaiac result negative.  Musculoskeletal:        General: No tenderness. Normal range of motion.     Cervical  back: Neck supple.  Lymphadenopathy:     Cervical: No cervical adenopathy.  Skin:    General: Skin is warm and dry.     Coloration: Skin is not pale.     Findings: No erythema or rash.  Neurological:     Mental Status: He is alert and oriented to person, place, and time.     Cranial Nerves: No cranial nerve deficit.     Motor: No abnormal muscle tone.     Coordination: Coordination normal.     Deep Tendon Reflexes: Reflexes are normal and symmetric. Reflexes normal.  Psychiatric:        Behavior: Behavior normal.        Thought Content: Thought content normal.        Judgment: Judgment normal.           Assessment & Plan:  Well exam. We discussed diet and exercise. Get fasting labs. Gershon Crane, MD

## 2022-01-26 MED ORDER — CIPROFLOXACIN HCL 500 MG PO TABS: 500.0000 mg | ORAL_TABLET | Freq: Two times a day (BID) | ORAL | 0 refills | Status: AC

## 2022-01-26 NOTE — Addendum Note (Signed)
Addended by: Johnella Moloney on: 01/26/2022 11:12 AM   Modules accepted: Orders

## 2022-02-10 ENCOUNTER — Other Ambulatory Visit (INDEPENDENT_AMBULATORY_CARE_PROVIDER_SITE_OTHER): Payer: 59

## 2022-02-10 DIAGNOSIS — N3000 Acute cystitis without hematuria: Secondary | ICD-10-CM | POA: Diagnosis not present

## 2022-02-10 LAB — URINALYSIS, ROUTINE W REFLEX MICROSCOPIC
Bilirubin Urine: NEGATIVE
Hgb urine dipstick: NEGATIVE
Ketones, ur: NEGATIVE
Nitrite: NEGATIVE
RBC / HPF: NONE SEEN (ref 0–?)
Specific Gravity, Urine: 1.02 (ref 1.000–1.030)
Total Protein, Urine: NEGATIVE
Urine Glucose: NEGATIVE
Urobilinogen, UA: 0.2 (ref 0.0–1.0)
pH: 5.5 (ref 5.0–8.0)

## 2022-02-11 MED ORDER — CIPROFLOXACIN HCL 500 MG PO TABS: 500.0000 mg | ORAL_TABLET | Freq: Two times a day (BID) | ORAL | 0 refills | Status: DC

## 2022-02-25 ENCOUNTER — Encounter: Payer: Self-pay | Admitting: Family Medicine

## 2022-02-26 NOTE — Telephone Encounter (Signed)
Please have him come by for another UA (no OV needed)

## 2022-02-27 ENCOUNTER — Other Ambulatory Visit (INDEPENDENT_AMBULATORY_CARE_PROVIDER_SITE_OTHER): Payer: 59

## 2022-02-27 DIAGNOSIS — N3000 Acute cystitis without hematuria: Secondary | ICD-10-CM | POA: Diagnosis not present

## 2022-02-27 LAB — URINALYSIS
Bilirubin Urine: NEGATIVE
Hgb urine dipstick: NEGATIVE
Ketones, ur: NEGATIVE
Leukocytes,Ua: NEGATIVE
Nitrite: NEGATIVE
Specific Gravity, Urine: 1.025 (ref 1.000–1.030)
Total Protein, Urine: NEGATIVE
Urine Glucose: NEGATIVE
Urobilinogen, UA: 0.2 (ref 0.0–1.0)
pH: 5.5 (ref 5.0–8.0)

## 2022-07-09 HISTORY — PX: COLONOSCOPY: SHX174

## 2022-07-09 LAB — HM COLONOSCOPY

## 2023-01-25 ENCOUNTER — Ambulatory Visit (INDEPENDENT_AMBULATORY_CARE_PROVIDER_SITE_OTHER): Payer: Medicare Other | Admitting: Family Medicine

## 2023-01-25 ENCOUNTER — Encounter: Payer: Self-pay | Admitting: Family Medicine

## 2023-01-25 VITALS — BP 108/70 | HR 61 | Temp 98.1°F | Ht 66.5 in | Wt 161.0 lb

## 2023-01-25 DIAGNOSIS — G4733 Obstructive sleep apnea (adult) (pediatric): Secondary | ICD-10-CM

## 2023-01-25 DIAGNOSIS — H6991 Unspecified Eustachian tube disorder, right ear: Secondary | ICD-10-CM

## 2023-01-25 DIAGNOSIS — N138 Other obstructive and reflux uropathy: Secondary | ICD-10-CM

## 2023-01-25 DIAGNOSIS — N401 Enlarged prostate with lower urinary tract symptoms: Secondary | ICD-10-CM

## 2023-01-25 DIAGNOSIS — E782 Mixed hyperlipidemia: Secondary | ICD-10-CM

## 2023-01-25 DIAGNOSIS — R739 Hyperglycemia, unspecified: Secondary | ICD-10-CM | POA: Diagnosis not present

## 2023-01-25 DIAGNOSIS — M19041 Primary osteoarthritis, right hand: Secondary | ICD-10-CM

## 2023-01-25 DIAGNOSIS — M19042 Primary osteoarthritis, left hand: Secondary | ICD-10-CM

## 2023-01-25 DIAGNOSIS — F419 Anxiety disorder, unspecified: Secondary | ICD-10-CM

## 2023-01-25 LAB — CBC WITH DIFFERENTIAL/PLATELET
Basophils Absolute: 0.1 10*3/uL (ref 0.0–0.1)
Basophils Relative: 0.8 % (ref 0.0–3.0)
Eosinophils Absolute: 0 10*3/uL (ref 0.0–0.7)
Eosinophils Relative: 0.5 % (ref 0.0–5.0)
HCT: 49 % (ref 39.0–52.0)
Hemoglobin: 16.1 g/dL (ref 13.0–17.0)
Lymphocytes Relative: 18.9 % (ref 12.0–46.0)
Lymphs Abs: 1.1 10*3/uL (ref 0.7–4.0)
MCHC: 32.9 g/dL (ref 30.0–36.0)
MCV: 88.9 fl (ref 78.0–100.0)
Monocytes Absolute: 0.6 10*3/uL (ref 0.1–1.0)
Monocytes Relative: 10 % (ref 3.0–12.0)
Neutro Abs: 4.2 10*3/uL (ref 1.4–7.7)
Neutrophils Relative %: 69.8 % (ref 43.0–77.0)
Platelets: 215 10*3/uL (ref 150.0–400.0)
RBC: 5.51 Mil/uL (ref 4.22–5.81)
RDW: 14 % (ref 11.5–15.5)
WBC: 6 10*3/uL (ref 4.0–10.5)

## 2023-01-25 LAB — HEPATIC FUNCTION PANEL
ALT: 19 U/L (ref 0–53)
AST: 17 U/L (ref 0–37)
Albumin: 4.8 g/dL (ref 3.5–5.2)
Alkaline Phosphatase: 38 U/L — ABNORMAL LOW (ref 39–117)
Bilirubin, Direct: 0.2 mg/dL (ref 0.0–0.3)
Total Bilirubin: 1 mg/dL (ref 0.2–1.2)
Total Protein: 7.1 g/dL (ref 6.0–8.3)

## 2023-01-25 LAB — LIPID PANEL
Cholesterol: 189 mg/dL (ref 0–200)
HDL: 54.6 mg/dL (ref >39.00)
LDL Cholesterol: 122 mg/dL — ABNORMAL HIGH (ref 0–99)
NonHDL: 134.78
Total CHOL/HDL Ratio: 3
Triglycerides: 66 mg/dL (ref 0.0–149.0)
VLDL: 13.2 mg/dL (ref 0.0–40.0)

## 2023-01-25 LAB — BASIC METABOLIC PANEL
BUN: 28 mg/dL — ABNORMAL HIGH (ref 6–23)
CO2: 26 mEq/L (ref 19–32)
Calcium: 10.1 mg/dL (ref 8.4–10.5)
Chloride: 104 mEq/L (ref 96–112)
Creatinine, Ser: 1.1 mg/dL (ref 0.40–1.50)
GFR: 67.98 mL/min (ref >60.00)
Glucose, Bld: 104 mg/dL — ABNORMAL HIGH (ref 70–99)
Potassium: 5.1 mEq/L (ref 3.5–5.1)
Sodium: 140 mEq/L (ref 135–145)

## 2023-01-25 LAB — PSA: PSA: 3.24 ng/mL (ref 0.10–4.00)

## 2023-01-25 LAB — HEMOGLOBIN A1C: Hgb A1c MFr Bld: 6.1 % (ref 4.6–6.5)

## 2023-01-25 LAB — TSH: TSH: 0.96 u[IU]/mL (ref 0.35–5.50)

## 2023-01-25 NOTE — Progress Notes (Signed)
Subjective:    Patient ID: John Soto, male    DOB: Jun 13, 1951, 72 y.o.   MRN: [id number]  HPI Here to follow up on issues. He feels well in general.  His anxiety and allergies and OA are well controlled. He watches his diet and exercises. He recently retired and he plans to move to [place] later this year to be near his daughter and her family. He does ask about pressure in the right ear with some hearing loss for the past weeks. He has been taking Sudafed and Xyzal with no relief.    Review of Systems  Constitutional: Negative.   HENT:  Positive for ear pain and hearing loss.   Eyes: Negative.   Respiratory: Negative.    Cardiovascular: Negative.   Gastrointestinal: Negative.   Genitourinary: Negative.   Musculoskeletal:  Positive for arthralgias.  Skin: Negative.   Neurological: Negative.   Psychiatric/Behavioral: Negative.         Objective:   Physical Exam Constitutional:      General: He is not in acute distress.    Appearance: Normal appearance. He is well-developed. He is not diaphoretic.  HENT:     Head: Normocephalic and atraumatic.     Right Ear: Tympanic membrane, ear canal and external ear normal.     Left Ear: Tympanic membrane, ear canal and external ear normal.     Nose: Nose normal.     Mouth/Throat:     Pharynx: No oropharyngeal exudate.  Eyes:     General: No scleral icterus.       Right eye: No discharge.        Left eye: No discharge.     Conjunctiva/sclera: Conjunctivae normal.     Pupils: Pupils are equal, round, and reactive to light.  Neck:     Thyroid: No thyromegaly.     Vascular: No JVD.     Trachea: No tracheal deviation.  Cardiovascular:     Rate and Rhythm: Normal rate and regular rhythm.     Pulses: Normal pulses.     Heart sounds: Normal heart sounds. No murmur heard.    No friction rub. No gallop.  Pulmonary:     Effort: Pulmonary effort is normal. No respiratory distress.     Breath sounds: Normal breath sounds. No  wheezing or rales.  Chest:     Chest wall: No tenderness.  Abdominal:     General: Bowel sounds are normal. There is no distension.     Palpations: Abdomen is soft. There is no mass.     Tenderness: There is no abdominal tenderness. There is no guarding or rebound.  Genitourinary:    Penis: Normal. No tenderness.      Testes: Normal.     Prostate: Normal.     Rectum: Normal. Guaiac result negative.  Musculoskeletal:        General: No tenderness. Normal range of motion.     Cervical back: Neck supple.  Lymphadenopathy:     Cervical: No cervical adenopathy.  Skin:    General: Skin is warm and dry.     Coloration: Skin is not pale.     Findings: No erythema or rash.  Neurological:     General: No focal deficit present.     Mental Status: He is alert and oriented to person, place, and time.     Cranial Nerves: No cranial nerve deficit.     Motor: No abnormal muscle tone.     Coordination: Coordination normal.  Deep Tendon Reflexes: Reflexes are normal and symmetric. Reflexes normal.  Psychiatric:        Mood and Affect: Mood normal.        Behavior: Behavior normal.        Thought Content: Thought content normal.        Judgment: Judgment normal.           Assessment & Plan:  His anxiety and OA are stable. He has some eustachian tube dysfunction in the right ear, so he will add Flonase nasal sprays every day to his regimen. Get fasting labs to check lipids, etc. We spent a total of (34   ) minutes reviewing records and discussing these issues.  Gershon Crane, MD

## 2023-04-29 ENCOUNTER — Telehealth: Payer: Self-pay | Admitting: Family Medicine

## 2023-04-29 NOTE — Telephone Encounter (Signed)
Prescription Request  04/29/2023  LOV: 01/25/2023  What is the name of the medication or equipment? atorvastatin (LIPITOR) 20 MG tablet #30  Have you contacted your pharmacy to request a refill? No   Which pharmacy would you like this sent to?  [id number] [place], [place] - [address] [place] [address] Phone: [phone] Fax: [phone]     Patient notified that their request is being sent to the clinical staff for review and that they should receive a response within 2 business days.   Please advise at Mobile [phone] (mobile)

## 2023-04-30 ENCOUNTER — Other Ambulatory Visit: Payer: Self-pay

## 2023-04-30 MED ORDER — ATORVASTATIN CALCIUM 20 MG PO TABS: 20.0000 mg | ORAL_TABLET | Freq: Every day | ORAL | 3 refills | Status: DC

## 2023-04-30 NOTE — Telephone Encounter (Signed)
Done

## 2023-09-09 ENCOUNTER — Encounter: Payer: Self-pay | Admitting: Family Medicine

## 2024-01-27 ENCOUNTER — Ambulatory Visit: Admitting: Family Medicine

## 2024-01-27 ENCOUNTER — Encounter: Payer: Self-pay | Admitting: Family Medicine

## 2024-01-27 VITALS — BP 108/60 | HR 58 | Temp 98.0°F | Ht 66.0 in | Wt 166.4 lb

## 2024-01-27 DIAGNOSIS — G4733 Obstructive sleep apnea (adult) (pediatric): Secondary | ICD-10-CM

## 2024-01-27 DIAGNOSIS — R739 Hyperglycemia, unspecified: Secondary | ICD-10-CM | POA: Diagnosis not present

## 2024-01-27 DIAGNOSIS — M1991 Primary osteoarthritis, unspecified site: Secondary | ICD-10-CM

## 2024-01-27 DIAGNOSIS — E782 Mixed hyperlipidemia: Secondary | ICD-10-CM | POA: Diagnosis not present

## 2024-01-27 DIAGNOSIS — F419 Anxiety disorder, unspecified: Secondary | ICD-10-CM

## 2024-01-27 DIAGNOSIS — N401 Enlarged prostate with lower urinary tract symptoms: Secondary | ICD-10-CM

## 2024-01-27 DIAGNOSIS — Z87438 Personal history of other diseases of male genital organs: Secondary | ICD-10-CM

## 2024-01-27 DIAGNOSIS — N138 Other obstructive and reflux uropathy: Secondary | ICD-10-CM

## 2024-01-27 DIAGNOSIS — M19041 Primary osteoarthritis, right hand: Secondary | ICD-10-CM

## 2024-01-27 DIAGNOSIS — N50811 Right testicular pain: Secondary | ICD-10-CM

## 2024-01-27 LAB — URINALYSIS, ROUTINE W REFLEX MICROSCOPIC
Bilirubin Urine: NEGATIVE
Hgb urine dipstick: NEGATIVE
Ketones, ur: NEGATIVE
Nitrite: NEGATIVE
RBC / HPF: NONE SEEN (ref 0–?)
Specific Gravity, Urine: 1.01 (ref 1.000–1.030)
Total Protein, Urine: NEGATIVE
Urine Glucose: NEGATIVE
Urobilinogen, UA: 0.2 (ref 0.0–1.0)
pH: 6 (ref 5.0–8.0)

## 2024-01-27 LAB — TSH: TSH: 1.01 u[IU]/mL (ref 0.35–5.50)

## 2024-01-27 LAB — HEPATIC FUNCTION PANEL
ALT: 22 U/L (ref 0–53)
AST: 20 U/L (ref 0–37)
Albumin: 5 g/dL (ref 3.5–5.2)
Alkaline Phosphatase: 44 U/L (ref 39–117)
Bilirubin, Direct: 0.3 mg/dL (ref 0.0–0.3)
Total Bilirubin: 1.5 mg/dL — ABNORMAL HIGH (ref 0.2–1.2)
Total Protein: 7.4 g/dL (ref 6.0–8.3)

## 2024-01-27 LAB — CBC WITH DIFFERENTIAL/PLATELET
Basophils Absolute: 0 K/uL (ref 0.0–0.1)
Basophils Relative: 0.8 % (ref 0.0–3.0)
Eosinophils Absolute: 0.1 K/uL (ref 0.0–0.7)
Eosinophils Relative: 2.5 % (ref 0.0–5.0)
HCT: 48.7 % (ref 39.0–52.0)
Hemoglobin: 15.9 g/dL (ref 13.0–17.0)
Lymphocytes Relative: 27.5 % (ref 12.0–46.0)
Lymphs Abs: 1.2 K/uL (ref 0.7–4.0)
MCHC: 32.7 g/dL (ref 30.0–36.0)
MCV: 88.7 fl (ref 78.0–100.0)
Monocytes Absolute: 0.5 K/uL (ref 0.1–1.0)
Monocytes Relative: 10.6 % (ref 3.0–12.0)
Neutro Abs: 2.6 K/uL (ref 1.4–7.7)
Neutrophils Relative %: 58.6 % (ref 43.0–77.0)
Platelets: 205 K/uL (ref 150.0–400.0)
RBC: 5.49 Mil/uL (ref 4.22–5.81)
RDW: 13.9 % (ref 11.5–15.5)
WBC: 4.5 K/uL (ref 4.0–10.5)

## 2024-01-27 LAB — LIPID PANEL
Cholesterol: 173 mg/dL (ref 0–200)
HDL: 61.4 mg/dL (ref >39.00)
LDL Cholesterol: 98 mg/dL (ref 0–99)
NonHDL: 111.96
Total CHOL/HDL Ratio: 3
Triglycerides: 68 mg/dL (ref 0.0–149.0)
VLDL: 13.6 mg/dL (ref 0.0–40.0)

## 2024-01-27 LAB — PSA: PSA: 3.51 ng/mL (ref 0.10–4.00)

## 2024-01-27 LAB — HEMOGLOBIN A1C: Hgb A1c MFr Bld: 6.3 % (ref 4.6–6.5)

## 2024-01-27 NOTE — Progress Notes (Signed)
 Subjective:    Patient ID: John Soto, male    DOB: 1950/07/11, 73 y.o.   MRN: [id number]  HPI Here to follow up on issues. He has been doing well in general. He works out at [employer] 3-4 days a week. His anxiety is stable. His OA is stable. He does mention some mild pain in the right testicle that began about a week ago. No recent trauma. No urinary symptoms. Today it feels better.    Review of Systems  Constitutional: Negative.   HENT: Negative.    Eyes: Negative.   Respiratory: Negative.    Cardiovascular: Negative.   Gastrointestinal: Negative.   Genitourinary:  Positive for testicular pain.  Musculoskeletal: Negative.   Skin: Negative.   Neurological: Negative.   Psychiatric/Behavioral: Negative.         Objective:   Physical Exam Constitutional:      General: He is not in acute distress.    Appearance: Normal appearance. He is well-developed. He is not diaphoretic.  HENT:     Head: Normocephalic and atraumatic.     Right Ear: External ear normal.     Left Ear: External ear normal.     Nose: Nose normal.     Mouth/Throat:     Pharynx: No oropharyngeal exudate.  Eyes:     General: No scleral icterus.       Right eye: No discharge.        Left eye: No discharge.     Conjunctiva/sclera: Conjunctivae normal.     Pupils: Pupils are equal, round, and reactive to light.  Neck:     Thyroid : No thyromegaly.     Vascular: No JVD.     Trachea: No tracheal deviation.  Cardiovascular:     Rate and Rhythm: Normal rate and regular rhythm.     Pulses: Normal pulses.     Heart sounds: Normal heart sounds. No murmur heard.    No friction rub. No gallop.  Pulmonary:     Effort: Pulmonary effort is normal. No respiratory distress.     Breath sounds: Normal breath sounds. No wheezing or rales.  Chest:     Chest wall: No tenderness.  Abdominal:     General: Abdomen is flat. Bowel sounds are normal. There is no distension.     Palpations: Abdomen is soft. There is no mass.      Tenderness: There is no abdominal tenderness. There is no guarding or rebound.  Genitourinary:    Penis: Normal. No tenderness.      Testes: Normal.     Rectum: Normal. Guaiac result negative.     Comments: Prostate is mildly enlarged but not tender. No nodules  Musculoskeletal:        General: No tenderness. Normal range of motion.     Cervical back: Neck supple.  Lymphadenopathy:     Cervical: No cervical adenopathy.  Skin:    General: Skin is warm and dry.     Coloration: Skin is not pale.     Findings: No erythema or rash.  Neurological:     General: No focal deficit present.     Mental Status: He is alert and oriented to person, place, and time.     Cranial Nerves: No cranial nerve deficit.     Motor: No abnormal muscle tone.     Coordination: Coordination normal.     Deep Tendon Reflexes: Reflexes are normal and symmetric. Reflexes normal.  Psychiatric:  Mood and Affect: Mood normal.        Behavior: Behavior normal.        Thought Content: Thought content normal.        Judgment: Judgment normal.           Assessment & Plan:  His recent testicular discomfort has resolved. He will let us  know if it recurs. His OA and anxiety are stable. We will get labs to check lipids, etc. We spent a total of ( 32   ) minutes reviewing records and discussing these issues.  Garnette Olmsted, MD

## 2024-01-28 ENCOUNTER — Other Ambulatory Visit: Payer: Self-pay

## 2024-01-28 ENCOUNTER — Encounter: Payer: Self-pay | Admitting: Family Medicine

## 2024-01-28 ENCOUNTER — Ambulatory Visit: Payer: Self-pay | Admitting: Family Medicine

## 2024-01-28 DIAGNOSIS — R8271 Bacteriuria: Secondary | ICD-10-CM

## 2024-01-28 MED ORDER — SULFAMETHOXAZOLE-TRIMETHOPRIM 800-160 MG PO TABS: 1.0000 | ORAL_TABLET | Freq: Two times a day (BID) | ORAL | 0 refills | Status: DC

## 2024-01-28 MED ORDER — SULFAMETHOXAZOLE-TRIMETHOPRIM 800-160 MG PO TABS
1.0000 | ORAL_TABLET | Freq: Two times a day (BID) | ORAL | 0 refills | Status: AC
Start: 2024-01-28 — End: ?

## 2024-02-18 ENCOUNTER — Other Ambulatory Visit

## 2024-02-18 ENCOUNTER — Encounter: Payer: Self-pay | Admitting: Family Medicine

## 2024-02-18 DIAGNOSIS — R8271 Bacteriuria: Secondary | ICD-10-CM

## 2024-02-18 LAB — URINALYSIS, ROUTINE W REFLEX MICROSCOPIC
Bilirubin Urine: NEGATIVE
Hgb urine dipstick: NEGATIVE
Ketones, ur: NEGATIVE
Nitrite: NEGATIVE
Specific Gravity, Urine: >=1.03 — AB (ref 1.000–1.030)
Total Protein, Urine: NEGATIVE
Urine Glucose: NEGATIVE
Urobilinogen, UA: 0.2 (ref 0.0–1.0)
pH: 6 (ref 5.0–8.0)

## 2024-02-22 ENCOUNTER — Other Ambulatory Visit: Payer: Self-pay

## 2024-02-22 ENCOUNTER — Ambulatory Visit: Payer: Self-pay | Admitting: Family Medicine

## 2024-02-22 DIAGNOSIS — R8271 Bacteriuria: Secondary | ICD-10-CM

## 2024-02-22 MED ORDER — CIPROFLOXACIN HCL 500 MG PO TABS: 500.0000 mg | ORAL_TABLET | Freq: Two times a day (BID) | ORAL | 0 refills | Status: AC

## 2024-03-07 ENCOUNTER — Other Ambulatory Visit

## 2024-03-07 DIAGNOSIS — R8271 Bacteriuria: Secondary | ICD-10-CM

## 2024-03-08 LAB — URINE CULTURE
MICRO NUMBER:: 16974160
SPECIMEN QUALITY:: ADEQUATE

## 2024-03-09 ENCOUNTER — Ambulatory Visit: Payer: Self-pay | Admitting: Family Medicine

## 2024-03-24 ENCOUNTER — Other Ambulatory Visit: Payer: Self-pay | Admitting: Family Medicine
